# Patient Record
Sex: Male | Born: 2000 | Race: White | Hispanic: No | Marital: Single | Smoking: Current some day smoker
Health system: Southern US, Community
[De-identification: ages and names within clinical notes are randomized; demographics above are authoritative.]

---
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
## 2021-05-20 ENCOUNTER — Other Ambulatory Visit: Payer: Self-pay

## 2021-05-20 ENCOUNTER — Encounter: Payer: Self-pay | Admitting: Emergency Medicine

## 2021-05-20 ENCOUNTER — Ambulatory Visit
Admission: EM | Admit: 2021-05-20 | Discharge: 2021-05-20 | Disposition: A | Discharge status: Home / Self Care | Payer: Medicaid Other | Attending: Urgent Care | Admitting: Urgent Care

## 2021-05-20 DIAGNOSIS — U071 COVID-19: Secondary | ICD-10-CM | POA: Diagnosis not present

## 2021-05-20 DIAGNOSIS — F17200 Nicotine dependence, unspecified, uncomplicated: Secondary | ICD-10-CM

## 2021-05-20 MED ORDER — BENZONATATE 100 MG PO CAPS: 100.0000 mg | ORAL_CAPSULE | Freq: Three times a day (TID) | ORAL | 0 refills | Status: DC | PRN

## 2021-05-20 MED ORDER — PROMETHAZINE-DM 6.25-15 MG/5ML PO SYRP: 5.0000 mL | ORAL_SOLUTION | Freq: Every evening | ORAL | 0 refills | Status: DC | PRN

## 2021-05-20 MED ORDER — CETIRIZINE HCL 10 MG PO TABS: 10.0000 mg | ORAL_TABLET | Freq: Every day | ORAL | 0 refills | Status: DC

## 2021-05-20 MED ORDER — PSEUDOEPHEDRINE HCL 60 MG PO TABS: 60.0000 mg | ORAL_TABLET | Freq: Three times a day (TID) | ORAL | 0 refills | Status: DC | PRN

## 2021-05-20 MED ORDER — PAXLOVID (300/100) 20 X 150 MG & 10 X 100MG PO TBPK: 1.0000 | ORAL_TABLET | Freq: Two times a day (BID) | ORAL | 0 refills | Status: DC

## 2021-05-20 NOTE — ED Triage Notes (Signed)
Pt here with positive at-home covid test with sx since Friday. Interested in anti-virals. Sore throat and bodyaches.

## 2021-05-20 NOTE — ED Provider Notes (Signed)
°  [place]   MRN: [id number] DOB: May 11, 2001  Subjective:   George Jordan is a 21 y.o. male presenting for 4 day history of acute onset throat pain, body aches, coughing.  Patient took a COVID test at home and was positive.  Would like to have COVID antivirals.  Smokes 1 pack per week. Rarely smokes marijuana.  No chest pain, fevers, shortness of breath, wheezing.  Denies taking chronic medications.  Allergies  Allergen Reactions   Augmentin [Amoxicillin-Pot Clavulanate] Rash    History reviewed. No pertinent past medical history.   History reviewed. No pertinent surgical history.  History reviewed. No pertinent family history.  Social History   Tobacco Use   Smoking status: Some Days    Types: Cigarettes  Substance Use Topics   Alcohol use: Yes    Comment: occ   Drug use: Yes    Types: Marijuana    Comment: occ    ROS   Objective:   Vitals: BP 136/89    Pulse 67    Temp 97.6 F (36.4 C)    Resp 20    SpO2 98%   Physical Exam Constitutional:      General: He is not in acute distress.    Appearance: Normal appearance. He is well-developed and normal weight. He is not ill-appearing, toxic-appearing or diaphoretic.  HENT:     Head: Normocephalic and atraumatic.     Right Ear: External ear normal.     Left Ear: External ear normal.     Nose: Nose normal.     Mouth/Throat:     Pharynx: Oropharynx is clear.  Eyes:     General: No scleral icterus.       Right eye: No discharge.        Left eye: No discharge.     Extraocular Movements: Extraocular movements intact.     Pupils: Pupils are equal, round, and reactive to light.  Cardiovascular:     Rate and Rhythm: Normal rate.  Pulmonary:     Effort: Pulmonary effort is normal.  Musculoskeletal:     Cervical back: Normal range of motion.  Neurological:     Mental Status: He is alert and oriented to person, place, and time.  Psychiatric:        Mood and Affect: Mood normal.         Behavior: Behavior normal.        Thought Content: Thought content normal.        Judgment: Judgment normal.    Assessment and Plan :   PDMP not reviewed this encounter.  1. Clinical diagnosis of COVID-19   2. Smoker    Had an extensive discussion with patient about COVID antivirals.  He is a low risk patient but still would like to have Paxlovid.  I will prescribe this to him and advised that since we do not know his kidney function we are taking a risk with this as well.  He verbalized understanding and still wants to proceed.  Use supportive care otherwise.  COVID confirmation test pending. Deferred imaging given clear cardiopulmonary exam, hemodynamically stable vital signs. Counseled patient on potential for adverse effects with medications prescribed/recommended today, ER and return-to-clinic precautions discussed, patient verbalized understanding.    Jaynee Eagles, PA-C 05/20/21 1009

## 2021-05-22 LAB — SARS-COV-2, NAA 2 DAY TAT

## 2021-05-22 LAB — NOVEL CORONAVIRUS, NAA: SARS-CoV-2, NAA: DETECTED — AB

## 2022-05-22 ENCOUNTER — Ambulatory Visit
Admission: RE | Admit: 2022-05-22 | Discharge: 2022-05-22 | Disposition: A | Discharge status: Home / Self Care | Payer: Medicaid Other | Source: Ambulatory Visit | Attending: Family Medicine | Admitting: Family Medicine

## 2022-05-22 VITALS — BP 130/88 | HR 73 | Temp 98.6°F | Resp 18

## 2022-05-22 DIAGNOSIS — J029 Acute pharyngitis, unspecified: Secondary | ICD-10-CM | POA: Diagnosis not present

## 2022-05-22 DIAGNOSIS — R509 Fever, unspecified: Secondary | ICD-10-CM | POA: Diagnosis present

## 2022-05-22 DIAGNOSIS — R059 Cough, unspecified: Secondary | ICD-10-CM | POA: Insufficient documentation

## 2022-05-22 DIAGNOSIS — Z1152 Encounter for screening for COVID-19: Secondary | ICD-10-CM | POA: Diagnosis not present

## 2022-05-22 DIAGNOSIS — J069 Acute upper respiratory infection, unspecified: Secondary | ICD-10-CM | POA: Diagnosis not present

## 2022-05-22 MED ORDER — OSELTAMIVIR PHOSPHATE 75 MG PO CAPS: 75.0000 mg | ORAL_CAPSULE | Freq: Two times a day (BID) | ORAL | 0 refills | Status: DC

## 2022-05-22 NOTE — ED Triage Notes (Signed)
Pt reports a sore throat, fever, body chills, body aches, slight cough, headache x 3 days. Took tylenol which gave slight relief.

## 2022-05-22 NOTE — ED Provider Notes (Signed)
[facility]    CSN: [id number] Arrival date & time: 05/22/22  1510      History   Chief Complaint Chief Complaint  Patient presents with   Fever    Body aches, sore throat - Entered by patient    HPI KOVEN BELINSKY is a 22 y.o. male.   Presenting today with 3-day history of fever, chills, body aches, mild cough, congestion, headache, sore throat.  Denies chest pain, shortness of breath, abdominal pain, nausea vomiting or diarrhea.  Taking Tylenol with mild relief.  Multiple sick contacts recently.  History of seasonal allergies on antihistamines as needed.    History reviewed. No pertinent past medical history.  There are no problems to display for this patient.   History reviewed. No pertinent surgical history.     Home Medications    Prior to Admission medications   Medication Sig Start Date End Date Taking? Authorizing Provider  oseltamivir (TAMIFLU) 75 MG capsule Take 1 capsule (75 mg total) by mouth every 12 (twelve) hours. 05/22/22  Yes Volney American, PA-C  benzonatate (TESSALON) 100 MG capsule Take 1-2 capsules (100-200 mg total) by mouth 3 (three) times daily as needed for cough. 05/20/21   Jaynee Eagles, PA-C  cetirizine (ZYRTEC ALLERGY) 10 MG tablet Take 1 tablet (10 mg total) by mouth daily. 05/20/21   Jaynee Eagles, PA-C  ibuprofen (ADVIL,MOTRIN) 600 MG tablet Take 1 tablet (600 mg total) by mouth every 6 (six) hours as needed for pain. 01/10/13   Kristen Cardinal, NP  nirmatrelvir & ritonavir (PAXLOVID, 300/100,) 20 x 150 MG & 10 x 100MG  TBPK Take 1 tablet by mouth 2 (two) times daily. 05/20/21   Jaynee Eagles, PA-C  promethazine-dextromethorphan (PROMETHAZINE-DM) 6.25-15 MG/5ML syrup Take 5 mLs by mouth at bedtime as needed for cough. 05/20/21   Jaynee Eagles, PA-C  pseudoephedrine (SUDAFED) 60 MG tablet Take 1 tablet (60 mg total) by mouth every 8 (eight) hours as needed for congestion. 05/20/21   Jaynee Eagles, PA-C    Family History History reviewed. No  pertinent family history.  Social History Social History   Tobacco Use   Smoking status: Some Days    Types: Cigarettes  Substance Use Topics   Alcohol use: Yes    Comment: occ   Drug use: Yes    Types: Marijuana    Comment: occ     Allergies   Augmentin [amoxicillin-pot clavulanate]  Review of Systems Review of Systems PER HPI  Physical Exam Triage Vital Signs ED Triage Vitals  Enc Vitals Group     BP 05/22/22 1544 130/88     Pulse Rate 05/22/22 1544 73     Resp 05/22/22 1544 18     Temp 05/22/22 1544 98.6 F (37 C)     Temp Source 05/22/22 1544 Oral     SpO2 05/22/22 1544 98 %     Weight --      Height --      Head Circumference --      Peak Flow --      Pain Score 05/22/22 1545 0     Pain Loc --      Pain Edu? --      Excl. in [place]? --    No data found.  Updated Vital Signs BP 130/88 (BP Location: Right Arm)   Pulse 73   Temp 98.6 F (37 C) (Oral)   Resp 18   SpO2 98%   Visual Acuity Right Eye Distance:   Left  Eye Distance:   Bilateral Distance:    Right Eye Near:   Left Eye Near:    Bilateral Near:     Physical Exam Vitals and nursing note reviewed.  Constitutional:      Appearance: He is well-developed.  HENT:     Head: Atraumatic.     Right Ear: External ear normal.     Left Ear: External ear normal.     Nose: Rhinorrhea present.     Mouth/Throat:     Pharynx: Posterior oropharyngeal erythema present. No oropharyngeal exudate.  Eyes:     Conjunctiva/sclera: Conjunctivae normal.     Pupils: Pupils are equal, round, and reactive to light.  Cardiovascular:     Rate and Rhythm: Normal rate and regular rhythm.  Pulmonary:     Effort: Pulmonary effort is normal. No respiratory distress.     Breath sounds: No wheezing or rales.  Musculoskeletal:        General: Normal range of motion.     Cervical back: Normal range of motion and neck supple.  Lymphadenopathy:     Cervical: No cervical adenopathy.  Skin:    General: Skin is warm and  dry.  Neurological:     Mental Status: He is alert and oriented to person, place, and time.  Psychiatric:        Behavior: Behavior normal.      UC Treatments / Results  Labs (all labs ordered are listed, but only abnormal results are displayed) Labs Reviewed  SARS CORONAVIRUS 2 (TAT 6-24 HRS)    EKG   Radiology No results found.  Procedures Procedures (including critical care time)  Medications Ordered in UC Medications - No data to display  Initial Impression / Assessment and Plan / UC Course  I have reviewed the triage vital signs and the nursing notes.  Pertinent labs & imaging results that were available during my care of the patient were reviewed by me and considered in my medical decision making (see chart for details).     Vitals and exam reassuring today and suspicious for viral respiratory infection.  Suspect COVID versus flu, because we cannot test for flu today due to backorder on testing will treat with Tamiflu while awaiting COVID results.  Return for worsening symptoms.  Supportive over-the-counter medications reviewed.  Final Clinical Impressions(s) / UC Diagnoses   Final diagnoses:  Viral URI with cough     Discharge Instructions      Because we are unable to test you for flu today, we will start the treatment for this while awaiting your COVID test.  If your COVID test is positive you may stop the Tamiflu and someone will call you to discuss next steps.  You may take Tylenol, ibuprofen, DayQuil, NyQuil, Flonase, Mucinex for your symptoms    ED Prescriptions     Medication Sig Dispense Auth. Provider   oseltamivir (TAMIFLU) 75 MG capsule Take 1 capsule (75 mg total) by mouth every 12 (twelve) hours. 10 capsule Volney American, [place]      PDMP not reviewed this encounter.   Volney American, [place] 05/22/22 1627

## 2022-05-22 NOTE — Discharge Instructions (Signed)
Because we are unable to test you for flu today, we will start the treatment for this while awaiting your COVID test.  If your COVID test is positive you may stop the Tamiflu and someone will call you to discuss next steps.  You may take Tylenol, ibuprofen, DayQuil, NyQuil, Flonase, Mucinex for your symptoms

## 2022-05-23 LAB — SARS CORONAVIRUS 2 (TAT 6-24 HRS): SARS Coronavirus 2: NEGATIVE

## 2022-06-09 ENCOUNTER — Ambulatory Visit
Admission: RE | Admit: 2022-06-09 | Discharge: 2022-06-09 | Disposition: A | Discharge status: Home / Self Care | Payer: Medicaid Other | Source: Ambulatory Visit | Attending: Family Medicine | Admitting: Family Medicine

## 2022-06-09 VITALS — BP 147/80 | HR 90 | Temp 98.1°F | Resp 20

## 2022-06-09 DIAGNOSIS — L0501 Pilonidal cyst with abscess: Secondary | ICD-10-CM

## 2022-06-09 MED ORDER — SULFAMETHOXAZOLE-TRIMETHOPRIM 800-160 MG PO TABS: 1.0000 | ORAL_TABLET | Freq: Two times a day (BID) | ORAL | 0 refills | Status: AC

## 2022-06-09 MED ORDER — CHLORHEXIDINE GLUCONATE 4 % EX LIQD: Freq: Every day | CUTANEOUS | 0 refills | Status: DC | PRN

## 2022-06-09 MED ORDER — MUPIROCIN 2 % EX OINT: 1.0000 | TOPICAL_OINTMENT | Freq: Two times a day (BID) | CUTANEOUS | 0 refills | Status: DC

## 2022-06-09 NOTE — ED Triage Notes (Addendum)
Pt reports a boil/ abscess near the intergluteal cleft x 2 weeks ago. It has gotten bigger and more painful. Hurts to sit down and apply pressure.   Area is red and slightly raised on his left buttocks

## 2022-06-09 NOTE — ED Provider Notes (Signed)
[facility]    CSN: [id number] Arrival date & time: 06/09/22  1543      History   Chief Complaint Chief Complaint  Patient presents with   Abscess    Boil - Entered by patient    HPI George Jordan is a 22 y.o. male.   Patient presenting today with 2-week history of progressively worsening swollen and tender area to the gluteal cleft.  States it hurts to sit down or move.  Denies fever, chills, drainage, change to bowel movements, abdominal pain, nausea vomiting.  So far not tried anything over-the-counter for symptoms.  No known injury to the area, no past history of similar issues.    History reviewed. No pertinent past medical history.  There are no problems to display for this patient.   History reviewed. No pertinent surgical history.     Home Medications    Prior to Admission medications   Medication Sig Start Date End Date Taking? Authorizing Provider  chlorhexidine (HIBICLENS) 4 % external liquid Apply topically daily as needed. 06/09/22  Yes Volney American, PA-C  mupirocin ointment (BACTROBAN) 2 % Apply 1 Application topically 2 (two) times daily. 06/09/22  Yes Volney American, PA-C  sulfamethoxazole-trimethoprim (BACTRIM DS) 800-160 MG tablet Take 1 tablet by mouth 2 (two) times daily for 7 days. 06/09/22 06/16/22 Yes Volney American, PA-C  benzonatate (TESSALON) 100 MG capsule Take 1-2 capsules (100-200 mg total) by mouth 3 (three) times daily as needed for cough. 05/20/21   Jaynee Eagles, PA-C  cetirizine (ZYRTEC ALLERGY) 10 MG tablet Take 1 tablet (10 mg total) by mouth daily. 05/20/21   Jaynee Eagles, PA-C  ibuprofen (ADVIL,MOTRIN) 600 MG tablet Take 1 tablet (600 mg total) by mouth every 6 (six) hours as needed for pain. 01/10/13   Kristen Cardinal, NP  nirmatrelvir & ritonavir (PAXLOVID, 300/100,) 20 x 150 MG & 10 x 100MG  TBPK Take 1 tablet by mouth 2 (two) times daily. 05/20/21   Jaynee Eagles, PA-C  oseltamivir (TAMIFLU) 75 MG capsule Take  1 capsule (75 mg total) by mouth every 12 (twelve) hours. 05/22/22   Volney American, PA-C  promethazine-dextromethorphan (PROMETHAZINE-DM) 6.25-15 MG/5ML syrup Take 5 mLs by mouth at bedtime as needed for cough. 05/20/21   Jaynee Eagles, PA-C  pseudoephedrine (SUDAFED) 60 MG tablet Take 1 tablet (60 mg total) by mouth every 8 (eight) hours as needed for congestion. 05/20/21   Jaynee Eagles, PA-C    Family History History reviewed. No pertinent family history.  Social History Social History   Tobacco Use   Smoking status: Some Days    Types: Cigarettes  Substance Use Topics   Alcohol use: Yes    Comment: occ   Drug use: Yes    Types: Marijuana    Comment: occ     Allergies   Augmentin [amoxicillin-pot clavulanate]   Review of Systems Review of Systems Per HPI  Physical Exam Triage Vital Signs ED Triage Vitals  Enc Vitals Group     BP 06/09/22 1603 (!) 147/80     Pulse Rate 06/09/22 1603 90     Resp 06/09/22 1603 20     Temp 06/09/22 1603 98.1 F (36.7 C)     Temp Source 06/09/22 1603 Oral     SpO2 06/09/22 1603 98 %     Weight --      Height --      Head Circumference --      Peak Flow --  Pain Score 06/09/22 1606 10     Pain Loc --      Pain Edu? --      Excl. in [place]? --    No data found.  Updated Vital Signs BP (!) 147/80 (BP Location: Right Arm)   Pulse 90   Temp 98.1 F (36.7 C) (Oral)   Resp 20   SpO2 98%   Visual Acuity Right Eye Distance:   Left Eye Distance:   Bilateral Distance:    Right Eye Near:   Left Eye Near:    Bilateral Near:     Physical Exam Vitals and nursing note reviewed. Exam conducted with a chaperone present.  Constitutional:      Appearance: Normal appearance.  HENT:     Head: Atraumatic.  Eyes:     Extraocular Movements: Extraocular movements intact.     Conjunctiva/sclera: Conjunctivae normal.  Cardiovascular:     Rate and Rhythm: Normal rate and regular rhythm.  Pulmonary:     Effort: Pulmonary effort is  normal.     Breath sounds: Normal breath sounds.  Musculoskeletal:        General: Normal range of motion.     Cervical back: Normal range of motion and neck supple.  Skin:    General: Skin is warm.     Comments: Large fluctuant pilonidal abscess present to gluteal cleft, significantly tender to palpation, no active drainage  Neurological:     General: No focal deficit present.     Mental Status: He is oriented to person, place, and time.  Psychiatric:        Mood and Affect: Mood normal.        Thought Content: Thought content normal.        Judgment: Judgment normal.      UC Treatments / Results  Labs (all labs ordered are listed, but only abnormal results are displayed) Labs Reviewed - No data to display  EKG   Radiology No results found.  Procedures Incision and Drainage  Date/Time: 06/09/2022 6:17 PM  Performed by: Volney American, PA-C Authorized by: Volney American, PA-C   Consent:    Consent obtained:  Verbal   Consent given by:  Patient   Risks, benefits, and alternatives were discussed: yes     Risks discussed:  Bleeding, incomplete drainage and infection   Alternatives discussed:  Alternative treatment Universal protocol:    Procedure explained and questions answered to patient or proxy's satisfaction: yes     Relevant documents present and verified: yes     Patient identity confirmed:  Verbally with patient Location:    Type:  Pilonidal cyst   Size:  4 cm   Location: Gluteal cleft. Pre-procedure details:    Skin preparation:  Chlorhexidine with alcohol Sedation:    Sedation type:  None Anesthesia:    Anesthesia method:  Local infiltration   Local anesthetic:  Lidocaine 2% WITH epi Procedure type:    Complexity:  Simple Procedure details:    Ultrasound guidance: no     Needle aspiration: no     Incision types:  Stab incision   Incision depth:  Dermal   Wound management:  Probed and deloculated   Drainage:  Purulent   Drainage  amount:  Copious   Wound treatment:  Wound left open   Packing materials:  None Post-procedure details:    Procedure completion:  Tolerated well, no immediate complications  (including critical care time)  Medications Ordered in UC Medications - No data  to display  Initial Impression / Assessment and Plan / UC Course  I have reviewed the triage vital signs and the nursing notes.  Pertinent labs & imaging results that were available during my care of the patient were reviewed by me and considered in my medical decision making (see chart for details).     Abscess drained without complication, area cleaned and dressed and will start Bactrim, Hibiclens, Bactroban ointment.  Work note given.  General surgery information given in case returning  Final Clinical Impressions(s) / UC Diagnoses   Final diagnoses:  Pilonidal abscess   Discharge Instructions   None    ED Prescriptions     Medication Sig Dispense Auth. Provider   sulfamethoxazole-trimethoprim (BACTRIM DS) 800-160 MG tablet Take 1 tablet by mouth 2 (two) times daily for 7 days. 14 tablet Particia Nearing, [place]   chlorhexidine (HIBICLENS) 4 % external liquid Apply topically daily as needed. 120 mL Particia Nearing, PA-C   mupirocin ointment (BACTROBAN) 2 % Apply 1 Application topically 2 (two) times daily. 22 g Particia Nearing, [place]      PDMP not reviewed this encounter.   Particia Nearing, [place] 06/09/22 1819

## 2022-07-23 ENCOUNTER — Ambulatory Visit
Admission: RE | Admit: 2022-07-23 | Discharge: 2022-07-23 | Disposition: A | Discharge status: Home / Self Care | Payer: Medicaid Other | Source: Ambulatory Visit | Attending: Nurse Practitioner | Admitting: Nurse Practitioner

## 2022-07-23 VITALS — BP 176/83 | HR 86 | Temp 98.2°F | Resp 16

## 2022-07-23 DIAGNOSIS — R21 Rash and other nonspecific skin eruption: Secondary | ICD-10-CM | POA: Diagnosis not present

## 2022-07-23 MED ORDER — TRIAMCINOLONE ACETONIDE 0.1 % EX CREA: 1.0000 | TOPICAL_CREAM | Freq: Two times a day (BID) | CUTANEOUS | 0 refills | Status: DC

## 2022-07-23 NOTE — ED Triage Notes (Signed)
Pt reports rash in back and left hand x 3-4 days. Pt think can be poison ok/ivy. Benadryl cream relief he itchiness.

## 2022-07-23 NOTE — Discharge Instructions (Signed)
Take medication as prescribed. May also take over-the-counter Zyrtec to help with itching. You may continue Benadryl as directed at bedtime. Avoid hot baths or showers while symptoms persist.  Recommend taking lukewarm baths. May apply cool cloths to the area to help with itching or discomfort. Avoid scratching, rubbing, or manipulating the areas while symptoms persist. Recommend Aveeno Colloidal Oatmeal Bath to use to help with drying and itching. Follow up if symptoms do not improve.

## 2022-07-23 NOTE — ED Provider Notes (Signed)
[facility]    CSN: [id number] Arrival date & time: 07/23/22  1600      History   Chief Complaint Chief Complaint  Patient presents with   Rash    Entered by patient    HPI George Jordan is a 22 y.o. male.   The history is provided by the patient.   The patient presents for complaints of rash to the left lower side of his back and to the left hand.  Patient states rash has been present for the past 3 to 4 days.  He states the rash is itchy.  He states that the area has also increased in size.  Patient denies fever, chills, pain to the rash, fluctuance, oozing, or drainage.  Patient reports he has been taking Benadryl at nighttime to help with itching.  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  History reviewed. No pertinent past medical history.  There are no problems to display for this patient.   History reviewed. No pertinent surgical history.     Home Medications    Prior to Admission medications   Medication Sig Start Date End Date Taking? Authorizing Provider  triamcinolone cream (KENALOG) 0.1 % Apply 1 Application topically 2 (two) times daily. 07/23/22  Yes Khadim Lundberg-Warren, Alda Lea, NP  benzonatate (TESSALON) 100 MG capsule Take 1-2 capsules (100-200 mg total) by mouth 3 (three) times daily as needed for cough. 05/20/21   Jaynee Eagles, PA-C  cetirizine (ZYRTEC ALLERGY) 10 MG tablet Take 1 tablet (10 mg total) by mouth daily. 05/20/21   Jaynee Eagles, PA-C  chlorhexidine (HIBICLENS) 4 % external liquid Apply topically daily as needed. 06/09/22   Volney American, PA-C  ibuprofen (ADVIL,MOTRIN) 600 MG tablet Take 1 tablet (600 mg total) by mouth every 6 (six) hours as needed for pain. 01/10/13   Kristen Cardinal, NP  mupirocin ointment (BACTROBAN) 2 % Apply 1 Application topically 2 (two) times daily. 06/09/22   Volney American, PA-C  nirmatrelvir & ritonavir (PAXLOVID, 300/100,) 20 x 150 MG &  10 x '100MG'$  TBPK Take 1 tablet by mouth 2 (two) times daily. 05/20/21   Jaynee Eagles, PA-C  oseltamivir (TAMIFLU) 75 MG capsule Take 1 capsule (75 mg total) by mouth every 12 (twelve) hours. 05/22/22   Volney American, PA-C  promethazine-dextromethorphan (PROMETHAZINE-DM) 6.25-15 MG/5ML syrup Take 5 mLs by mouth at bedtime as needed for cough. 05/20/21   Jaynee Eagles, PA-C  pseudoephedrine (SUDAFED) 60 MG tablet Take 1 tablet (60 mg total) by mouth every 8 (eight) hours as needed for congestion. 05/20/21   Jaynee Eagles, PA-C    Family History History reviewed. No pertinent family history.  Social History Social History   Tobacco Use   Smoking status: Some Days    Types: Cigarettes  Substance Use Topics   Alcohol use: Yes    Comment: occ   Drug use: Yes    Types: Marijuana    Comment: occ     Allergies   Augmentin [amoxicillin-pot clavulanate]   Review of Systems Review of Systems Per HPI  Physical Exam Triage Vital Signs ED Triage Vitals  Enc Vitals Group     BP 07/23/22 1605 (!) 176/83     Pulse Rate 07/23/22 1605 86     Resp 07/23/22 1605 16     Temp 07/23/22 1605 98.2 F (36.8 C)     Temp Source 07/23/22 1605 Oral  SpO2 07/23/22 1605 96 %     Weight --      Height --      Head Circumference --      Peak Flow --      Pain Score 07/23/22 1608 0     Pain Loc --      Pain Edu? --      Excl. in [place]? --    No data found.  Updated Vital Signs BP (!) 176/83 (BP Location: Right Arm)   Pulse 86   Temp 98.2 F (36.8 C) (Oral)   Resp 16   SpO2 96%   Visual Acuity Right Eye Distance:   Left Eye Distance:   Bilateral Distance:    Right Eye Near:   Left Eye Near:    Bilateral Near:     Physical Exam   UC Treatments / Results  Labs (all labs ordered are listed, but only abnormal results are displayed) Labs Reviewed - No data to display  EKG   Radiology No results found.  Procedures Procedures (including critical care time)  Medications Ordered  in UC Medications - No data to display  Initial Impression / Assessment and Plan / UC Course  I have reviewed the triage vital signs and the nursing notes.  Pertinent labs & imaging results that were available during my care of the patient were reviewed by me and considered in my medical decision making (see chart for details).  The patient is well-appearing, he is in no acute distress, vital signs are stable.  Patient with rash to the left lower back into the left hand.  Differential diagnoses include contact dermatitis, eczema, or shingles.  This likely symptoms appear to be consistent with contact dermatitis.  Will treat patient symptomatically with triamcinolone cream 0.1%.  Supportive care recommendations were provided to the patient, along with indications of when follow-up may be indicated..  Patient is in agreement with this plan of care and verbalizes understanding.  All questions were answered.  Patient stable for discharge.  Final Clinical Impressions(s) / UC Diagnoses   Final diagnoses:  Rash and nonspecific skin eruption     Discharge Instructions      Take medication as prescribed. May also take over-the-counter Zyrtec to help with itching. You may continue Benadryl as directed at bedtime. Avoid hot baths or showers while symptoms persist.  Recommend taking lukewarm baths. May apply cool cloths to the area to help with itching or discomfort. Avoid scratching, rubbing, or manipulating the areas while symptoms persist. Recommend Aveeno Colloidal Oatmeal Bath to use to help with drying and itching. Follow up if symptoms do not improve.      ED Prescriptions     Medication Sig Dispense Auth. Provider   triamcinolone cream (KENALOG) 0.1 % Apply 1 Application topically 2 (two) times daily. 45 g Vasilios Ottaway-Warren, Alda Lea, NP      PDMP not reviewed this encounter.   Tish Men, NP 07/23/22 1623

## 2022-09-17 ENCOUNTER — Ambulatory Visit: Payer: Self-pay

## 2022-09-17 ENCOUNTER — Ambulatory Visit
Admission: EM | Admit: 2022-09-17 | Discharge: 2022-09-17 | Disposition: A | Discharge status: Home / Self Care | Payer: Medicaid Other | Attending: Nurse Practitioner | Admitting: Nurse Practitioner

## 2022-09-17 DIAGNOSIS — J020 Streptococcal pharyngitis: Secondary | ICD-10-CM | POA: Diagnosis not present

## 2022-09-17 LAB — POCT RAPID STREP A (OFFICE): Rapid Strep A Screen: POSITIVE — AB

## 2022-09-17 MED ORDER — LIDOCAINE VISCOUS HCL 2 % MT SOLN: 5.0000 mL | Freq: Four times a day (QID) | OROMUCOSAL | 0 refills | Status: DC | PRN

## 2022-09-17 MED ORDER — AZITHROMYCIN 500 MG PO TABS: 500.0000 mg | ORAL_TABLET | Freq: Every day | ORAL | 0 refills | Status: AC

## 2022-09-17 NOTE — ED Provider Notes (Signed)
[facility]    CSN: [id number] Arrival date & time: 09/17/22  1018      History   Chief Complaint No chief complaint on file.   HPI George Jordan is a 22 y.o. male.   The history is provided by the patient.   The patient presents for complaints of sore throat, and chills, fever.  Patient states symptoms started over the last several days.  He states this morning when he woke up around 7 AM, he had a fever of 101.  Patient states that he has pain with swallowing, and talking.  Patient states that he has also had a mild cough and intermittent abdominal pain.  He states that is also difficult for him to eat due to his throat pain and swelling.  Patient denies ear pain, ear drainage, headache, wheezing, shortness of breath, difficulty breathing, nausea, vomiting, or diarrhea.  Patient denies any obvious known sick contacts.  Reports he has been taking ibuprofen for his symptoms.  History reviewed. No pertinent past medical history.  There are no problems to display for this patient.   History reviewed. No pertinent surgical history.     Home Medications    Prior to Admission medications   Medication Sig Start Date End Date Taking? Authorizing Provider  azithromycin (ZITHROMAX) 500 MG tablet Take 1 tablet (500 mg total) by mouth daily for 5 days. 09/17/22 09/22/22 Yes Camyah Pultz-Warren, Sadie Haber, NP  lidocaine (XYLOCAINE) 2 % solution Use as directed 5 mLs in the mouth or throat every 6 (six) hours as needed for mouth pain. Gargle and spit 5 mL every 6 hours as needed for throat pain or discomfort. 09/17/22  Yes Terrica Duecker-Warren, Sadie Haber, NP  benzonatate (TESSALON) 100 MG capsule Take 1-2 capsules (100-200 mg total) by mouth 3 (three) times daily as needed for cough. 05/20/21   Wallis Bamberg, PA-C  cetirizine (ZYRTEC ALLERGY) 10 MG tablet Take 1 tablet (10 mg total) by mouth daily. 05/20/21   Wallis Bamberg, PA-C  chlorhexidine (HIBICLENS) 4 % external liquid Apply topically daily as  needed. 06/09/22   Particia Nearing, PA-C  ibuprofen (ADVIL,MOTRIN) 600 MG tablet Take 1 tablet (600 mg total) by mouth every 6 (six) hours as needed for pain. 01/10/13   Lowanda Foster, NP  mupirocin ointment (BACTROBAN) 2 % Apply 1 Application topically 2 (two) times daily. 06/09/22   Particia Nearing, PA-C  nirmatrelvir & ritonavir (PAXLOVID, 300/100,) 20 x 150 MG & 10 x 100MG  TBPK Take 1 tablet by mouth 2 (two) times daily. 05/20/21   Wallis Bamberg, PA-C  oseltamivir (TAMIFLU) 75 MG capsule Take 1 capsule (75 mg total) by mouth every 12 (twelve) hours. 05/22/22   Particia Nearing, PA-C  promethazine-dextromethorphan (PROMETHAZINE-DM) 6.25-15 MG/5ML syrup Take 5 mLs by mouth at bedtime as needed for cough. 05/20/21   Wallis Bamberg, PA-C  pseudoephedrine (SUDAFED) 60 MG tablet Take 1 tablet (60 mg total) by mouth every 8 (eight) hours as needed for congestion. 05/20/21   Wallis Bamberg, PA-C  triamcinolone cream (KENALOG) 0.1 % Apply 1 Application topically 2 (two) times daily. 07/23/22   Cecile Gillispie-Warren, Sadie Haber, NP    Family History History reviewed. No pertinent family history.  Social History Social History   Tobacco Use   Smoking status: Some Days    Types: Cigarettes  Substance Use Topics   Alcohol use: Yes    Comment: occ   Drug use: Yes    Types: Marijuana    Comment: occ  Allergies   Augmentin [amoxicillin-pot clavulanate]   Review of Systems Review of Systems Per HPI  Physical Exam Triage Vital Signs ED Triage Vitals  Enc Vitals Group     BP 09/17/22 1040 136/83     Pulse Rate 09/17/22 1040 81     Resp 09/17/22 1040 18     Temp 09/17/22 1040 98.2 F (36.8 C)     Temp Source 09/17/22 1040 Oral     SpO2 09/17/22 1040 98 %     Weight --      Height --      Head Circumference --      Peak Flow --      Pain Score 09/17/22 1042 7     Pain Loc --      Pain Edu? --      Excl. in [place]? --    No data found.  Updated Vital Signs BP 136/83 (BP Location: Right  Arm)   Pulse 81   Temp 98.2 F (36.8 C) (Oral)   Resp 18   SpO2 98%   Visual Acuity Right Eye Distance:   Left Eye Distance:   Bilateral Distance:    Right Eye Near:   Left Eye Near:    Bilateral Near:     Physical Exam Vitals and nursing note reviewed.  Constitutional:      General: He is not in acute distress.    Appearance: Normal appearance.  HENT:     Head: Normocephalic.     Right Ear: Tympanic membrane, ear canal and external ear normal.     Left Ear: Tympanic membrane, ear canal and external ear normal.     Nose: Nose normal.     Mouth/Throat:     Lips: Pink.     Mouth: Mucous membranes are moist.     Pharynx: Pharyngeal swelling, oropharyngeal exudate, posterior oropharyngeal erythema and uvula swelling present.     Tonsils: Tonsillar exudate present. 2+ on the right. 2+ on the left.  Eyes:     Extraocular Movements: Extraocular movements intact.     Conjunctiva/sclera: Conjunctivae normal.     Pupils: Pupils are equal, round, and reactive to light.  Cardiovascular:     Rate and Rhythm: Normal rate and regular rhythm.     Pulses: Normal pulses.     Heart sounds: Normal heart sounds.  Pulmonary:     Effort: Pulmonary effort is normal. No respiratory distress.     Breath sounds: Normal breath sounds. No stridor. No wheezing, rhonchi or rales.  Abdominal:     General: Bowel sounds are normal.     Palpations: Abdomen is soft.     Tenderness: There is no abdominal tenderness.  Musculoskeletal:     Cervical back: Normal range of motion.  Lymphadenopathy:     Cervical: Cervical adenopathy present.  Skin:    General: Skin is warm and dry.  Neurological:     General: No focal deficit present.     Mental Status: He is alert and oriented to person, place, and time.  Psychiatric:        Mood and Affect: Mood normal.        Behavior: Behavior normal.      UC Treatments / Results  Labs (all labs ordered are listed, but only abnormal results are  displayed) Labs Reviewed  POCT RAPID STREP A (OFFICE) - Abnormal; Notable for the following components:      Result Value   Rapid Strep A Screen Positive (*)  All other components within normal limits    EKG   Radiology No results found.  Procedures Procedures (including critical care time)  Medications Ordered in UC Medications - No data to display  Initial Impression / Assessment and Plan / UC Course  I have reviewed the triage vital signs and the nursing notes.  Pertinent labs & imaging results that were available during my care of the patient were reviewed by me and considered in my medical decision making (see chart for details).  The patient is well-appearing, he is in no acute distress, vital signs are stable.  Rapid strep test is positive.  Will treat for strep throat with azithromycin 500 mg daily for the next 5 days given the patient's history of allergies to Augmentin.  Patient was also prescribed viscous lidocaine 2% to gargle and spit as needed for throat pain or discomfort.  Supportive care recommendations were provided and discussed with the patient to include continuing over-the-counter analgesics for pain or discomfort, recommending a soft diet, and warm salt water gargles.  Patient was advised that if symptoms do not improve, recommend that he follow-up in this clinic or with his PCP for further evaluation.  Patient is in agreement with this plan of care and verbalizes understanding.  All questions were answered.  Patient stable for discharge.   Final Clinical Impressions(s) / UC Diagnoses   Final diagnoses:  Streptococcal sore throat     Discharge Instructions      Take medication as prescribed. Increase fluids and allow for plenty of rest. Recommend over-the-counter Tylenol or ibuprofen as needed for pain, fever, or general discomfort. Warm salt water gargles 3-4 times daily to help with throat pain or discomfort. Recommend a diet with soft foods to  include soups, broths, puddings, yogurt, Jell-O's, soft vegetables, fruit, or popsicles until symptoms improve. Change toothbrush after 3 days. If symptoms do not improve after completing this antibiotic, please follow-up in this clinic or with your primary care physician for further evaluation. Follow-up as needed.     ED Prescriptions     Medication Sig Dispense Auth. Provider   azithromycin (ZITHROMAX) 500 MG tablet Take 1 tablet (500 mg total) by mouth daily for 5 days. 5 tablet Sedonia Kitner-Warren, Sadie Haber, NP   lidocaine (XYLOCAINE) 2 % solution Use as directed 5 mLs in the mouth or throat every 6 (six) hours as needed for mouth pain. Gargle and spit 5 mL every 6 hours as needed for throat pain or discomfort. 100 mL Shervin Cypert-Warren, Sadie Haber, NP      PDMP not reviewed this encounter.   Abran Cantor, NP 09/17/22 1109

## 2022-09-17 NOTE — Discharge Instructions (Addendum)
Take medication as prescribed. Increase fluids and allow for plenty of rest. Recommend over-the-counter Tylenol or ibuprofen as needed for pain, fever, or general discomfort. Warm salt water gargles 3-4 times daily to help with throat pain or discomfort. Recommend a diet with soft foods to include soups, broths, puddings, yogurt, Jell-O's, soft vegetables, fruit, or popsicles until symptoms improve. Change toothbrush after 3 days. If symptoms do not improve after completing this antibiotic, please follow-up in this clinic or with your primary care physician for further evaluation. Follow-up as needed.

## 2022-09-17 NOTE — ED Triage Notes (Signed)
Pt c/o sore throat and fever 101. At 7:00 am, sore throat x 3 days

## 2023-03-09 ENCOUNTER — Ambulatory Visit
Admission: RE | Admit: 2023-03-09 | Discharge: 2023-03-09 | Disposition: A | Discharge status: Home / Self Care | Payer: Medicaid Other | Source: Ambulatory Visit | Attending: Nurse Practitioner | Admitting: Nurse Practitioner

## 2023-03-09 VITALS — BP 149/82 | HR 66 | Temp 98.2°F | Resp 17

## 2023-03-09 DIAGNOSIS — R21 Rash and other nonspecific skin eruption: Secondary | ICD-10-CM

## 2023-03-09 MED ORDER — DEXAMETHASONE SODIUM PHOSPHATE 10 MG/ML IJ SOLN
10.0000 mg | Freq: Once | INTRAMUSCULAR | Status: AC
  Administered 2023-03-09: 10 mg via INTRAMUSCULAR

## 2023-03-09 MED ORDER — TRIAMCINOLONE ACETONIDE 0.1 % EX OINT: 1.0000 | TOPICAL_OINTMENT | Freq: Two times a day (BID) | CUTANEOUS | 0 refills | Status: DC

## 2023-03-09 NOTE — ED Provider Notes (Signed)
[facility]    CSN: [id number] Arrival date & time: 03/09/23  1048      History   Chief Complaint Chief Complaint  Patient presents with   Poison Ivy    Rash on thigh, hip & arm - Entered by patient    HPI George Jordan is a 22 y.o. male.   Patient presents today for itchy rash to right arm and right thigh, left hip that has been ongoing for the past 5 days.  Reports prior to symptoms starting, he was working outside and noticed he was working in weeds.  Reports the areas are raised, red, and itchy.  No fever or nausea/vomiting.  No active drainage from the areas.  Patient denies recent change in any detergents, soaps, personal care products.  No recent change in medications or supplements.  Has been using over-the-counter calamine spray without relief.     History reviewed. No pertinent past medical history.  There are no problems to display for this patient.   History reviewed. No pertinent surgical history.     Home Medications    Prior to Admission medications   Medication Sig Start Date End Date Taking? Authorizing Provider  triamcinolone ointment (KENALOG) 0.1 % Apply 1 Application topically 2 (two) times daily. Apply sparingly to affected areas twice daily.  Do not use for more than 14 days in a row to prevent permanent skin thinning. 03/09/23  Yes Valentino Nose, NP    Family History History reviewed. No pertinent family history.  Social History Social History   Tobacco Use   Smoking status: Some Days    Types: Cigarettes  Substance Use Topics   Alcohol use: Yes    Comment: occ   Drug use: Yes    Types: Marijuana    Comment: occ     Allergies   Augmentin [amoxicillin-pot clavulanate]   Review of Systems Review of Systems Per HPI  Physical Exam Triage Vital Signs ED Triage Vitals  Encounter Vitals Group     BP 03/09/23 1128 (!) 149/82     Systolic BP Percentile --      Diastolic BP Percentile --      Pulse Rate  03/09/23 1128 66     Resp 03/09/23 1128 17     Temp 03/09/23 1128 98.2 F (36.8 C)     Temp Source 03/09/23 1128 Oral     SpO2 03/09/23 1128 97 %     Weight --      Height --      Head Circumference --      Peak Flow --      Pain Score 03/09/23 1130 0     Pain Loc --      Pain Education --      Exclude from Growth Chart --    No data found.  Updated Vital Signs BP (!) 149/82 (BP Location: Right Arm)   Pulse 66   Temp 98.2 F (36.8 C) (Oral)   Resp 17   SpO2 97%   Visual Acuity Right Eye Distance:   Left Eye Distance:   Bilateral Distance:    Right Eye Near:   Left Eye Near:    Bilateral Near:     Physical Exam Vitals and nursing note reviewed.  Constitutional:      General: He is not in acute distress.    Appearance: Normal appearance. He is not toxic-appearing.  HENT:     Head: Normocephalic and atraumatic.     Mouth/Throat:  Mouth: Mucous membranes are moist.     Pharynx: Oropharynx is clear.  Pulmonary:     Effort: Pulmonary effort is normal. No respiratory distress.     Breath sounds: No wheezing, rhonchi or rales.  Skin:    General: Skin is warm and dry.     Capillary Refill: Capillary refill takes less than 2 seconds.     Coloration: Skin is not jaundiced or pale.     Findings: Erythema and rash present.     Comments: Erythematous, raised rash noted to right forearm and right inner thigh.  No fluctuance, warmth, active drainage.  Neurological:     Mental Status: He is alert and oriented to person, place, and time.  Psychiatric:        Behavior: Behavior is cooperative.      UC Treatments / Results  Labs (all labs ordered are listed, but only abnormal results are displayed) Labs Reviewed - No data to display  EKG   Radiology No results found.  Procedures Procedures (including critical care time)  Medications Ordered in UC Medications  dexamethasone (DECADRON) injection 10 mg (10 mg Intramuscular Given 03/09/23 1156)    Initial  Impression / Assessment and Plan / UC Course  I have reviewed the triage vital signs and the nursing notes.  Pertinent labs & imaging results that were available during my care of the patient were reviewed by me and considered in my medical decision making (see chart for details).   Patient is well-appearing, normotensive, afebrile, not tachycardic, not tachypneic, oxygenating well on room air.    1. Rash and nonspecific skin eruption Noted flags; vitals and exam are reassuring Treat with Decadron 10 mg IM in urgent care today, start topical corticosteroid ointment-sent to pharmacy Also recommended oral antihistamine regimen Strict ER and return precautions discussed with patient  The patient was given the opportunity to ask questions.  All questions answered to their satisfaction.  The patient is in agreement to this plan.    Final Clinical Impressions(s) / UC Diagnoses   Final diagnoses:  Rash and nonspecific skin eruption     Discharge Instructions      We gave you an injection of steroid medication today to help with inflammation from the rash.  Start using the topical ointment I sent to the pharmacy to help dry the rash up.  You can also take Zyrtec, Claritin, or Allegra adult dose twice daily to help with itching.  Seek care if symptoms do not improve with this treatment.    ED Prescriptions     Medication Sig Dispense Auth. Provider   triamcinolone ointment (KENALOG) 0.1 % Apply 1 Application topically 2 (two) times daily. Apply sparingly to affected areas twice daily.  Do not use for more than 14 days in a row to prevent permanent skin thinning. 15 g Valentino Nose, NP      PDMP not reviewed this encounter.   Valentino Nose, NP 03/09/23 [phone]

## 2023-03-09 NOTE — ED Triage Notes (Addendum)
Pt c/o rash on left hip right thigh and right  arm x 5 days constant itching

## 2023-03-09 NOTE — Discharge Instructions (Signed)
We gave you an injection of steroid medication today to help with inflammation from the rash.  Start using the topical ointment I sent to the pharmacy to help dry the rash up.  You can also take Zyrtec, Claritin, or Allegra adult dose twice daily to help with itching.  Seek care if symptoms do not improve with this treatment.

## 2023-06-21 ENCOUNTER — Ambulatory Visit
Admission: RE | Admit: 2023-06-21 | Discharge: 2023-06-21 | Disposition: A | Discharge status: Home / Self Care | Payer: 59 | Source: Ambulatory Visit | Attending: Nurse Practitioner | Admitting: Nurse Practitioner

## 2023-06-21 VITALS — BP 149/96 | HR 58 | Temp 98.4°F | Resp 18

## 2023-06-21 DIAGNOSIS — L0591 Pilonidal cyst without abscess: Secondary | ICD-10-CM

## 2023-06-21 MED ORDER — SULFAMETHOXAZOLE-TRIMETHOPRIM 800-160 MG PO TABS: 1.0000 | ORAL_TABLET | Freq: Two times a day (BID) | ORAL | 0 refills | Status: AC

## 2023-06-21 MED ORDER — CHLORHEXIDINE GLUCONATE 4 % EX SOLN: Freq: Two times a day (BID) | CUTANEOUS | 0 refills | Status: DC

## 2023-06-21 MED ORDER — MUPIROCIN 2 % EX OINT: 1.0000 | TOPICAL_OINTMENT | Freq: Two times a day (BID) | CUTANEOUS | 0 refills | Status: DC

## 2023-06-21 NOTE — Discharge Instructions (Addendum)
Take medication as prescribed. Warm compresses to the affected area 3-4 times daily. Keep the area covered while it is draining. Go to the emergency department if you develop fever, chills, generalized fatigue, nausea, vomiting, or if the area of redness spreads into the genital region, or if you have foul-smelling drainage.  Please contact the general surgeons office tomorrow to schedule an appointment for further evaluation. Follow-up as needed.

## 2023-06-21 NOTE — ED Triage Notes (Signed)
Pt reports he has an abscess on his a buttocks x 1 week.

## 2023-06-21 NOTE — ED Provider Notes (Signed)
[facility]    CSN: [id number] Arrival date & time: 06/21/23  1330      History   Chief Complaint Chief Complaint  Patient presents with   Abscess    Boil on behind - Entered by patient    HPI George CONRY is a 23 y.o. male.   The history is provided by the patient.   Patient presenting today with 1-week history of progressively worsening swollen and tender area to the gluteal cleft.  States it hurts to sit down or move.  Denies fever, chills, drainage, change to bowel movements, abdominal pain, nausea vomiting.  So far not tried anything over-the-counter for symptoms.  No known injury to the area, reports prior history of same.   History reviewed. No pertinent past medical history.  There are no active problems to display for this patient.   History reviewed. No pertinent surgical history.     Home Medications    Prior to Admission medications   Medication Sig Start Date End Date Taking? Authorizing Provider  triamcinolone ointment (KENALOG) 0.1 % Apply 1 Application topically 2 (two) times daily. Apply sparingly to affected areas twice daily.  Do not use for more than 14 days in a row to prevent permanent skin thinning. 03/09/23   Valentino Nose, NP    Family History History reviewed. No pertinent family history.  Social History Social History   Tobacco Use   Smoking status: Some Days    Types: Cigarettes  Substance Use Topics   Alcohol use: Yes    Comment: occ   Drug use: Yes    Types: Marijuana    Comment: occ     Allergies   Augmentin [amoxicillin-pot clavulanate]   Review of Systems Review of Systems   Physical Exam Triage Vital Signs ED Triage Vitals  Encounter Vitals Group     BP 06/21/23 1453 (!) 149/96     Systolic BP Percentile --      Diastolic BP Percentile --      Pulse Rate 06/21/23 1453 (!) 58     Resp 06/21/23 1453 18     Temp 06/21/23 1453 98.4 F (36.9 C)     Temp src --      SpO2 06/21/23 1453 98 %      Weight --      Height --      Head Circumference --      Peak Flow --      Pain Score 06/21/23 1414 5     Pain Loc --      Pain Education --      Exclude from Growth Chart --    No data found.  Updated Vital Signs BP (!) 149/96   Pulse (!) 58   Temp 98.4 F (36.9 C)   Resp 18   SpO2 98%   Visual Acuity Right Eye Distance:   Left Eye Distance:   Bilateral Distance:    Right Eye Near:   Left Eye Near:    Bilateral Near:     Physical Exam Vitals and nursing note reviewed.  Constitutional:      General: He is not in acute distress.    Appearance: Normal appearance.  HENT:     Head: Normocephalic.  Eyes:     Extraocular Movements: Extraocular movements intact.     Pupils: Pupils are equal, round, and reactive to light.  Abdominal:     General: Bowel sounds are normal.     Palpations: Abdomen is  soft.  Skin:    Comments: Fluctuant pilonidal abscess present to gluteal cleft, significantly tender to palpation, no active drainage.   Neurological:     Mental Status: He is alert.  Psychiatric:        Mood and Affect: Mood normal.        Behavior: Behavior normal.      UC Treatments / Results  Labs (all labs ordered are listed, but only abnormal results are displayed) Labs Reviewed - No data to display  EKG   Radiology No results found.  Procedures Incision and Drainage  Date/Time: 06/21/2023 3:40 PM  Performed by: Abran Cantor, NP Authorized by: Abran Cantor, NP   Consent:    Consent obtained:  Verbal   Consent given by:  Patient   Risks discussed:  Bleeding, incomplete drainage and infection   Alternatives discussed:  No treatment Universal protocol:    Procedure explained and questions answered to patient or proxy's satisfaction: yes     Patient identity confirmed:  Verbally with patient Location:    Type:  Pilonidal cyst   Size:  2.5cm   Location: natal cleft. Pre-procedure details:    Procedure prep: Sterile water and  Hibiclens soap. Anesthesia:    Anesthesia method:  Local infiltration   Local anesthetic:  Lidocaine 2% w/o epi Procedure type:    Complexity:  Simple Procedure details:    Ultrasound guidance: no     Needle aspiration: no     Incision types:  Single straight   Drainage:  Bloody   Drainage amount:  Moderate   Wound treatment:  Wound left open   Packing materials:  None Post-procedure details:    Procedure completion:  Tolerated Comments:     Pilonidal cyst noted to the natal cleft.  Local anesthesia achieved with lidocaine 2% without epi, 3 mL.  #11 blade used to perform single straight incision.  Moderate bloody drainage.  Patient tolerated the procedure well.  Antibiotic ointment and dressing applied to the site.  (including critical care time)  Medications Ordered in UC Medications - No data to display  Initial Impression / Assessment and Plan / UC Course  I have reviewed the triage vital signs and the nursing notes.  Pertinent labs & imaging results that were available during my care of the patient were reviewed by me and considered in my medical decision making (see chart for details).  Pilonidal cyst present to the natal cleft.  Moderate bloody drainage present.  Will start patient on Bactrim DS to cover for bacterial etiology.  Hibiclens soap was also provided for patient to cleanse the area along with mupirocin 2% ointment.  Supportive care recommendations were provided and discussed with the patient to include fluids, continuing over-the-counter analgesics, and warm compresses.  Patient was provided a referral to general surgery for further evaluation.  Patient was in agreement with this plan of care and verbalizes understanding.  All questions were answered.  Patient stable for discharge.  Final Clinical Impressions(s) / UC Diagnoses   Final diagnoses:  None   Discharge Instructions   None    ED Prescriptions   None    PDMP not reviewed this encounter.    Abran Cantor, NP 06/21/23 [phone]

## 2023-07-09 ENCOUNTER — Ambulatory Visit: Payer: 59 | Admitting: General Surgery

## 2023-07-21 ENCOUNTER — Ambulatory Visit: Payer: 59 | Admitting: Surgery

## 2023-07-21 ENCOUNTER — Encounter: Payer: Self-pay | Admitting: Surgery

## 2023-07-21 VITALS — BP 146/87 | HR 94 | Temp 98.0°F | Resp 14 | Ht 68.5 in | Wt 259.0 lb

## 2023-07-21 DIAGNOSIS — L0591 Pilonidal cyst without abscess: Secondary | ICD-10-CM

## 2023-07-21 NOTE — Progress Notes (Unsigned)
 [facility] History and Physical  Reason for Referral: Pilonidal cyst Referring Physician: Urgent Care  Chief Complaint   New Patient (Initial Visit)     George Jordan is a 23 y.o. male.  HPI: Patient presents for evaluation of a pilonidal cyst.  It has been present for at least 1 to 2 years.  It initially came up and he was able to pop it.  Since that time, it will continue to return and cause him pain, especially when sitting.  He will always try to pop the area, but recently he has had more difficulty with popping it.  He has had 2-3 incision and drainages performed at urgent care.  His most recent incision and drainage was early February.  He was also put on antibiotics at that time.  He has since finished the antibiotics.  He denies any other lumps or bumps anywhere on his body.  He denies any significant past medical history.  He denies ever having any surgeries.  He consumes a vape and goes through a cartridge about every 2 weeks.  He will rarely consume alcohol.  He denies use of illicit drugs.  No past medical history on file.  No past surgical history on file.  No family history on file.  Social History   Tobacco Use   Smoking status: Some Days    Types: Cigarettes  Substance Use Topics   Alcohol use: Yes    Comment: occ   Drug use: Yes    Types: Marijuana    Comment: occ    Medications: I have reviewed the patient's current medications. Allergies as of 07/21/2023       Reactions   Augmentin [amoxicillin-pot Clavulanate] Rash        Medication List        Accurate as of July 21, 2023  9:58 AM. If you have any questions, ask your nurse or doctor.          STOP taking these medications    chlorhexidine 4 % external liquid Commonly known as: Hibiclens Stopped by: George Jordan   mupirocin ointment 2 % Commonly known as: BACTROBAN Stopped by: George Jordan   triamcinolone ointment 0.1 % Commonly known as:  KENALOG Stopped by: George Jordan         ROS:  Constitutional: negative for chills, fatigue, and fevers Eyes: negative for visual disturbance and pain Ears, nose, mouth, throat, and face: negative for ear drainage, sore throat, and sinus problems Respiratory: negative for cough, wheezing, and shortness of breath Cardiovascular: negative for chest pain and palpitations Gastrointestinal: negative for abdominal pain, nausea, reflux symptoms, and vomiting Genitourinary:negative for dysuria and frequency Integument/breast: negative for dryness and rash Hematologic/lymphatic: negative for bleeding and lymphadenopathy Musculoskeletal:negative for back pain and neck pain Neurological: negative for dizziness and tremors Endocrine: negative for temperature intolerance  Blood pressure (!) 146/87, pulse 94, temperature 98 F (36.7 C), temperature source Oral, resp. rate 14, height 5' 8.5" (1.74 m), weight 259 lb (117.5 kg), SpO2 94%. Physical Exam Vitals reviewed.  Constitutional:      Appearance: Normal appearance.  HENT:     Head: Normocephalic and atraumatic.     Mouth/Throat:     Mouth: Mucous membranes are moist.  Cardiovascular:     Rate and Rhythm: Normal rate and regular rhythm.  Pulmonary:     Effort: Pulmonary effort is normal.     Breath sounds: Normal breath sounds.  Abdominal:     General: There  is no distension.     Palpations: Abdomen is soft.     Tenderness: There is no abdominal tenderness.  Musculoskeletal:        General: Normal range of motion.     Cervical back: Normal range of motion.  Skin:    General: Skin is warm and dry.     Comments: Small cystic area measuring 1 to 2 cm in size on the upper left gluteal cheek, no surrounding erythema or induration, nontender to palpation  Neurological:     General: No focal deficit present.     Mental Status: He is alert and oriented to person, place, and time.  Psychiatric:        Mood and Affect: Mood  normal.        Behavior: Behavior normal.    Results: No results found for this or any previous visit (from the past 48 hours).  No results found.   Assessment & Plan:  George Jordan is a 23 y.o. male who presents for evaluation of a pilonidal cyst.  -We discussed that pilonidal cysts/ sinuses are abnormal tissue under the skin that is prone to infections. It is more common in patients that are overweight, family history of a pilonidal cyst, deep gluteal cleft and thicker body hair, especially in the region of the gluteal cleft. We discussed that the exact cause of the disease is unknown but could be related to ingrown hairs/ and inflammation in the area coupled with mechanical / shear forces. We discussed that these cysts and sinuses can be surgically removed but that they often recur.  Prior to surgery, options for management include keeping the area clean by removal of hair from the area to decrease bacterial load and prevent trapping of feces in the area, cleansing the area after bowel movements, and daily to twice daily showering as well as keeping the area dry and wicking away moisture from the area if needed with dry gauze replaced often.  Also discussed pulling hair from the pits as this is one cause of the sinus getting clogged and infected.   -The risk and benefits of pilonidal cyst excision were discussed including but not limited to bleeding, infection, injury to surrounding structures, need for additional procedures, and pilonidal cyst recurrence.  After careful consideration, George Jordan has decided to proceed with surgery.  -Patient tentatively scheduled for surgery on 3/20 -Information provided to the patient regarding pilonidal cyst and pilonidal cyst removal -Advised that he needs to call the office or present to an urgent care if he begins to have worsening pain, fever, chills, or purulent drainage from the area  All questions were answered to the satisfaction of the patient  and family.  Note: Portions of this report may have been transcribed using voice recognition software. Every effort has been made to ensure accuracy; however, inadvertent computerized transcription errors may still be present.   Theophilus Kinds, DO [facility] [address] [place], [place] [address] [phone] (office)

## 2023-07-22 NOTE — H&P (Signed)
 [facility] History and Physical  Reason for Referral: Pilonidal cyst Referring Physician: Urgent Care  Chief Complaint   New Patient (Initial Visit)     George Jordan is a 23 y.o. male.  HPI: Patient presents for evaluation of a pilonidal cyst.  It has been present for at least 1 to 2 years.  It initially came up and he was able to pop it.  Since that time, it will continue to return and cause him pain, especially when sitting.  He will always try to pop the area, but recently he has had more difficulty with popping it.  He has had 2-3 incision and drainages performed at urgent care.  His most recent incision and drainage was early February.  He was also put on antibiotics at that time.  He has since finished the antibiotics.  He denies any other lumps or bumps anywhere on his body.  He denies any significant past medical history.  He denies ever having any surgeries.  He consumes a vape and goes through a cartridge about every 2 weeks.  He will rarely consume alcohol.  He denies use of illicit drugs.  No past medical history on file.  No past surgical history on file.  No family history on file.  Social History   Tobacco Use   Smoking status: Some Days    Types: Cigarettes  Substance Use Topics   Alcohol use: Yes    Comment: occ   Drug use: Yes    Types: Marijuana    Comment: occ    Medications: I have reviewed the patient's current medications. Allergies as of 07/21/2023       Reactions   Augmentin [amoxicillin-pot Clavulanate] Rash        Medication List        Accurate as of July 21, 2023  9:58 AM. If you have any questions, ask your nurse or doctor.          STOP taking these medications    chlorhexidine 4 % external liquid Commonly known as: Hibiclens Stopped by: Ignatius Kloos A Jireh Elmore   mupirocin ointment 2 % Commonly known as: BACTROBAN Stopped by: Laurabelle Gorczyca A Brenleigh Collet   triamcinolone ointment 0.1 % Commonly known as:  KENALOG Stopped by: Azul Coffie A Rio Kidane         ROS:  Constitutional: negative for chills, fatigue, and fevers Eyes: negative for visual disturbance and pain Ears, nose, mouth, throat, and face: negative for ear drainage, sore throat, and sinus problems Respiratory: negative for cough, wheezing, and shortness of breath Cardiovascular: negative for chest pain and palpitations Gastrointestinal: negative for abdominal pain, nausea, reflux symptoms, and vomiting Genitourinary:negative for dysuria and frequency Integument/breast: negative for dryness and rash Hematologic/lymphatic: negative for bleeding and lymphadenopathy Musculoskeletal:negative for back pain and neck pain Neurological: negative for dizziness and tremors Endocrine: negative for temperature intolerance  Blood pressure (!) 146/87, pulse 94, temperature 98 F (36.7 C), temperature source Oral, resp. rate 14, height 5' 8.5" (1.74 m), weight 259 lb (117.5 kg), SpO2 94%. Physical Exam Vitals reviewed.  Constitutional:      Appearance: Normal appearance.  HENT:     Head: Normocephalic and atraumatic.     Mouth/Throat:     Mouth: Mucous membranes are moist.  Cardiovascular:     Rate and Rhythm: Normal rate and regular rhythm.  Pulmonary:     Effort: Pulmonary effort is normal.     Breath sounds: Normal breath sounds.  Abdominal:     General: There  is no distension.     Palpations: Abdomen is soft.     Tenderness: There is no abdominal tenderness.  Musculoskeletal:        General: Normal range of motion.     Cervical back: Normal range of motion.  Skin:    General: Skin is warm and dry.     Comments: Small cystic area measuring 1 to 2 cm in size on the upper left gluteal cheek, no surrounding erythema or induration, nontender to palpation  Neurological:     General: No focal deficit present.     Mental Status: He is alert and oriented to person, place, and time.  Psychiatric:        Mood and Affect: Mood  normal.        Behavior: Behavior normal.    Results: No results found for this or any previous visit (from the past 48 hours).  No results found.   Assessment & Plan:  George Jordan is a 23 y.o. male who presents for evaluation of a pilonidal cyst.  -We discussed that pilonidal cysts/ sinuses are abnormal tissue under the skin that is prone to infections. It is more common in patients that are overweight, family history of a pilonidal cyst, deep gluteal cleft and thicker body hair, especially in the region of the gluteal cleft. We discussed that the exact cause of the disease is unknown but could be related to ingrown hairs/ and inflammation in the area coupled with mechanical / shear forces. We discussed that these cysts and sinuses can be surgically removed but that they often recur.  Prior to surgery, options for management include keeping the area clean by removal of hair from the area to decrease bacterial load and prevent trapping of feces in the area, cleansing the area after bowel movements, and daily to twice daily showering as well as keeping the area dry and wicking away moisture from the area if needed with dry gauze replaced often.  Also discussed pulling hair from the pits as this is one cause of the sinus getting clogged and infected.   -The risk and benefits of pilonidal cyst excision were discussed including but not limited to bleeding, infection, injury to surrounding structures, need for additional procedures, and pilonidal cyst recurrence.  After careful consideration, George Jordan has decided to proceed with surgery.  -Patient tentatively scheduled for surgery on 3/20 -Information provided to the patient regarding pilonidal cyst and pilonidal cyst removal -Advised that he needs to call the office or present to an urgent care if he begins to have worsening pain, fever, chills, or purulent drainage from the area  All questions were answered to the satisfaction of the patient  and family.  Note: Portions of this report may have been transcribed using voice recognition software. Every effort has been made to ensure accuracy; however, inadvertent computerized transcription errors may still be present.   Theophilus Kinds, DO [facility] [address] [place], [place] [address] [phone] (office)

## 2023-08-03 NOTE — Patient Instructions (Signed)
 George Jordan  08/03/2023     @PREFPERIOPPHARMACY @   Your procedure is scheduled on  08/06/2023.   Report to [facility] at  0815  A.M.   Call this number if you have problems the morning of surgery:  [phone]  If you experience any cold or flu symptoms such as cough, fever, chills, shortness of breath, etc. between now and your scheduled surgery, please notify us at the above number.   Remember:  Do not eat after midnight.   You may drink clear liquids until  0615 am on 08/06/2023.   Clear liquids allowed are:                    Water, Juice (No red color; non-citric and without pulp; diabetics please choose diet or no sugar options), Carbonated beverages (diabetics please choose diet or no sugar options), Clear Tea (No creamer, milk, or cream, including half & half and powdered creamer), Black Coffee Only (No creamer, milk or cream, including half & half and powdered creamer), and Clear Sports drink (No red color; diabetics please choose diet or no sugar options)    Take these medicines the morning of surgery with A SIP OF WATER                                                        None.    Do not wear jewelry, make-up or nail polish, including gel polish,  artificial nails, or any other type of covering on natural nails (fingers and  toes).  Do not wear lotions, powders, or perfumes, or deodorant.  Do not shave 48 hours prior to surgery.  Men may shave face and neck.  Do not bring valuables to the hospital.  [facility] is not responsible for any belongings or valuables.  Contacts, dentures or bridgework may not be worn into surgery.  Leave your suitcase in the car.  After surgery it may be brought to your room.  For patients admitted to the hospital, discharge time will be determined by your treatment team.  Patients discharged the day of surgery will not be allowed to drive home and must have someone with them for 24 hours.    Special instructions:   DO NOT  smoke tobacco or vape for 24 hours before your procedure.  Please read over the following fact sheets that you were given. Coughing and Deep Breathing, Surgical Site Infection Prevention, Anesthesia Post-op Instructions, and Care and Recovery After Surgery       Pilonidal Cyst Drainage, Care After The following information offers guidance on how to care for yourself after your procedure. Your health care provider may also give you more specific instructions. If you have problems or questions, contact your health care provider. What can I expect after the procedure? After the procedure, it is common to have: Pain that gets better when you take medicine. Some fluid or blood coming from your wound. Follow these instructions at home: Medicines Take over-the-counter and prescription medicines only as told by your health care provider. If you were prescribed antibiotics, take them as told by your health care provider. Do not stop using the antibiotic even if you start to feel better. Ask your health care provider if the medicine prescribed to you: Requires you to  avoid driving or using machinery. Can cause constipation. You may need to take these actions to prevent or treat constipation: Drink enough fluid to keep your urine pale yellow. Take over-the-counter or prescription medicines. Eat foods that are high in fiber, such as beans, whole grains, and fresh fruits and vegetables. Limit foods that are high in fat and processed sugars, such as fried or sweet foods. Bathing Do not take baths or showers, swim, or use a hot tub until your health care provider approves. You may only be allowed to take sponge baths. When you can return to bathing will depend on the type of wound that you have. If your wound was packed with a germ-free packing material, keep the area dry until your packing has been removed. After the packing has been removed, you may start taking showers when your health care provider  approves. If the edges of the incision around your wound were stitched to your skin (marsupialization), you may start taking showers the day after surgery, or when your health care provider approves. Let the water from the shower moisten your bandage (dressing). This will make it easier to take off. Remove your dressing before you shower. While bathing, clean your buttocks area gently with soap and water. After bathing: Pat the area dry with a soft, clean towel. If directed, cover the area with a clean dressing. Wound care  You may need to have a caregiver help you with wound care and dressing changes. Follow instructions from your health care provider about how to take care of your wound. Make sure you: Wash your hands with soap and water for at least 20 seconds before and after you change your dressing. If soap and water are not available, use hand sanitizer. Change your dressing as told by your health care provider. Leave stitches (sutures), skin glue, or adhesive strips in place. These skin closures may need to stay in place for 2 weeks or longer. If adhesive strip edges start to loosen and curl up, you may trim the loose edges. Do not remove adhesive strips completely unless your health care provider tells you to do that. Check your wound every day for signs of infection. Check for: Redness, swelling, or more pain. More fluid or blood. Warmth. Pus or a bad smell. Follow any additional instructions from your health care provider on how to care for your wound, such as wound cleaning, wound flushing (irrigation), or packing your wound with a dressing. If you had marsupialization, ask your health care provider when you can stop using a dressing. Lifestyle Do not do activities that irritate or put pressure on your buttocks for about 2 weeks, or as long as told by your health care provider. These activities include bike riding, running, and anything that involves a twisting motion. Rest as told  by your health care provider. Do not sit for a long time without moving. Get up to take short walks every 1-2 hours. This will improve blood flow and breathing. Ask for help if you feel weak or unsteady. Sleep on your side instead of your back. Avoid wearing tight underwear and tight pants. Return to your normal activities as told by your health care provider. Ask your health care provider what activities are safe for you. General instructions Do not use any products that contain nicotine or tobacco. These products include cigarettes, chewing tobacco, and vaping devices, such as e-cigarettes. These can delay wound healing after surgery. If you need help quitting, ask your health care provider.  Keep all follow-up visits. This is important to monitor healing. If you had an incision and drainage procedure with wound packing, your packing may be changed or removed at follow-up visits. Contact a health care provider if: You have pain that does not get better with medicine. You have any of these signs of infection: More redness, swelling, or pain around your incision. More fluid or blood coming from your incision. Warmth coming from your incision. Pus or a bad smell coming from your incision. A fever. You have muscle aches. You feel generally sick. You are dizzy. Summary A pilonidal cyst is a fluid-filled sac that forms under the skin near the tailbone. It is common to have some fluid or blood coming from your wound after a procedure to drain a pilonidal cyst. If you were prescribed antibiotics, take them as told by your health care provider. Do not stop taking the antibiotic even if you start to feel better. You may need to have a caregiver help you with wound care and dressing changes. Return to your health care provider as instructed to have any packing material changed or removed. This information is not intended to replace advice given to you by your health care provider. Make sure you discuss  any questions you have with your health care provider. Document Revised: 07/31/2021 Document Reviewed: 07/31/2021 Elsevier Patient Education  2024 [employer] Inc.General Anesthesia, Adult, Care After The following information offers guidance on how to care for yourself after your procedure. Your health care provider may also give you more specific instructions. If you have problems or questions, contact your health care provider. What can I expect after the procedure? After the procedure, it is common for people to: Have pain or discomfort at the IV site. Have nausea or vomiting. Have a sore throat or hoarseness. Have trouble concentrating. Feel cold or chills. Feel weak, sleepy, or tired (fatigue). Have soreness and body aches. These can affect parts of the body that were not involved in surgery. Follow these instructions at home: For the time period you were told by your health care provider:  Rest. Do not participate in activities where you could fall or become injured. Do not drive or use machinery. Do not drink alcohol. Do not take sleeping pills or medicines that cause drowsiness. Do not make important decisions or sign legal documents. Do not take care of children on your own. General instructions Drink enough fluid to keep your urine pale yellow. If you have sleep apnea, surgery and certain medicines can increase your risk for breathing problems. Follow instructions from your health care provider about wearing your sleep device: Anytime you are sleeping, including during daytime naps. While taking prescription pain medicines, sleeping medicines, or medicines that make you drowsy. Return to your normal activities as told by your health care provider. Ask your health care provider what activities are safe for you. Take over-the-counter and prescription medicines only as told by your health care provider. Do not use any products that contain nicotine or tobacco. These products  include cigarettes, chewing tobacco, and vaping devices, such as e-cigarettes. These can delay incision healing after surgery. If you need help quitting, ask your health care provider. Contact a health care provider if: You have nausea or vomiting that does not get better with medicine. You vomit every time you eat or drink. You have pain that does not get better with medicine. You cannot urinate or have bloody urine. You develop a skin rash. You have a fever. Get help  right away if: You have trouble breathing. You have chest pain. You vomit blood. These symptoms may be an emergency. Get help right away. Call 911. Do not wait to see if the symptoms will go away. Do not drive yourself to the hospital. Summary After the procedure, it is common to have a sore throat, hoarseness, nausea, vomiting, or to feel weak, sleepy, or fatigue. For the time period you were told by your health care provider, do not drive or use machinery. Get help right away if you have difficulty breathing, have chest pain, or vomit blood. These symptoms may be an emergency. This information is not intended to replace advice given to you by your health care provider. Make sure you discuss any questions you have with your health care provider. Document Revised: 08/02/2021 Document Reviewed: 08/02/2021 Elsevier Patient Education  2024 Else[employer] Inc.How to Use Chlorhexidine at Home in the Shower Chlorhexidine gluconate (CHG) is a germ-killing (antiseptic) wash that's used to clean the skin. It can get rid of the germs that normally live on the skin and can keep them away for about 24 hours. If you're having surgery, you may be told to shower with CHG at home the night before surgery. This can help lower your risk for infection. To use CHG wash in the shower, follow the steps below. Supplies needed: CHG body wash. Clean washcloth. Clean towel. How to use CHG in the shower Follow these steps unless you're told to use CHG  in a different way: Start the shower. Use your normal soap and shampoo to wash your face and hair. Turn off the shower or move out of the shower stream. Pour CHG onto a clean washcloth. Do not use any type of brush or rough sponge. Start at your neck, washing your body down to your toes. Make sure you: Wash the part of your body where the surgery will be done for at least 1 minute. Do not scrub. Do not use CHG on your head or face unless your health care provider tells you to. If it gets into your ears or eyes, rinse them well with water. Do not wash your genitals with CHG. Wash your back and under your arms. Make sure to wash skin folds. Let the CHG sit on your skin for 1-2 minutes or as long as told. Rinse your entire body in the shower, including all body creases and folds. Turn off the shower. Dry off with a clean towel. Do not put anything on your skin afterward, such as powder, lotion, or perfume. Put on clean clothes or pajamas. If it's the night before surgery, sleep in clean sheets. General tips Use CHG only as told, and follow the instructions on the label. Use the full amount of CHG as told. This is often one bottle. Do not smoke and stay away from flames after using CHG. Your skin may feel sticky after using CHG. This is normal. The sticky feeling will go away as the CHG dries. Do not use CHG: If you have a chlorhexidine allergy or have reacted to chlorhexidine in the past. On open wounds or areas of skin that have broken skin, cuts, or scrapes. On babies younger than 21 months of age. Contact a health care provider if: You have questions about using CHG. Your skin gets irritated or itchy. You have a rash after using CHG. You swallow any CHG. Call your local poison control center 9198364659 in the [place] Your eyes itch badly, or they become very red or  swollen. Your hearing changes. You have trouble seeing. If you can't reach your provider, go to an urgent care or  emergency room. Do not drive yourself. Get help right away if: You have swelling or tingling in your mouth or throat. You make high-pitched whistling sounds when you breathe, most often when you breathe out (wheeze). You have trouble breathing. These symptoms may be an emergency. Call 911 right away. Do not wait to see if the symptoms will go away. Do not drive yourself to the hospital. This information is not intended to replace advice given to you by your health care provider. Make sure you discuss any questions you have with your health care provider. Document Revised: 11/18/2022 Document Reviewed: 11/14/2021 Elsevier Patient Education  2024 [employer].

## 2023-08-04 ENCOUNTER — Other Ambulatory Visit (HOSPITAL_COMMUNITY)

## 2023-08-05 ENCOUNTER — Encounter (HOSPITAL_COMMUNITY)
Admission: RE | Admit: 2023-08-05 | Discharge: 2023-08-05 | Disposition: A | Discharge status: Home / Self Care | Source: Ambulatory Visit | Attending: Surgery | Admitting: Surgery

## 2023-08-05 ENCOUNTER — Other Ambulatory Visit: Payer: Self-pay

## 2023-08-05 ENCOUNTER — Encounter (HOSPITAL_COMMUNITY): Payer: Self-pay

## 2023-08-05 VITALS — BP 146/87 | HR 94 | Temp 98.0°F | Resp 16 | Ht 68.5 in | Wt 259.0 lb

## 2023-08-05 DIAGNOSIS — I498 Other specified cardiac arrhythmias: Secondary | ICD-10-CM | POA: Diagnosis not present

## 2023-08-05 DIAGNOSIS — Z01818 Encounter for other preprocedural examination: Secondary | ICD-10-CM | POA: Diagnosis present

## 2023-08-05 DIAGNOSIS — F17200 Nicotine dependence, unspecified, uncomplicated: Secondary | ICD-10-CM | POA: Diagnosis not present

## 2023-08-05 DIAGNOSIS — R638 Other symptoms and signs concerning food and fluid intake: Secondary | ICD-10-CM | POA: Diagnosis not present

## 2023-08-06 ENCOUNTER — Ambulatory Visit (HOSPITAL_COMMUNITY): Payer: Self-pay

## 2023-08-06 ENCOUNTER — Encounter (HOSPITAL_COMMUNITY)
Admission: RE | Disposition: A | Discharge status: Home / Self Care | Payer: Self-pay | Source: Home / Self Care | Attending: Surgery

## 2023-08-06 ENCOUNTER — Encounter (HOSPITAL_COMMUNITY): Payer: Self-pay | Admitting: Surgery

## 2023-08-06 ENCOUNTER — Ambulatory Visit (HOSPITAL_COMMUNITY)
Admission: RE | Admit: 2023-08-06 | Discharge: 2023-08-06 | Disposition: A | Discharge status: Home / Self Care | Attending: Surgery | Admitting: Surgery

## 2023-08-06 DIAGNOSIS — F17200 Nicotine dependence, unspecified, uncomplicated: Secondary | ICD-10-CM

## 2023-08-06 DIAGNOSIS — L0591 Pilonidal cyst without abscess: Secondary | ICD-10-CM

## 2023-08-06 DIAGNOSIS — R638 Other symptoms and signs concerning food and fluid intake: Secondary | ICD-10-CM

## 2023-08-06 DIAGNOSIS — F17210 Nicotine dependence, cigarettes, uncomplicated: Secondary | ICD-10-CM | POA: Insufficient documentation

## 2023-08-06 HISTORY — PX: PILONIDAL CYST EXCISION: SHX744

## 2023-08-06 SURGERY — EXCISION, SIMPLE PILONIDAL CYST
Anesthesia: General | Site: Buttocks

## 2023-08-06 MED ORDER — FENTANYL CITRATE (PF) 100 MCG/2ML IJ SOLN: INTRAMUSCULAR | Status: DC | PRN

## 2023-08-06 MED ORDER — CLINDAMYCIN PHOSPHATE 900 MG/50ML IV SOLN
INTRAVENOUS | Status: DC | PRN
  Administered 2023-08-06: 900 mg via INTRAVENOUS

## 2023-08-06 MED ORDER — MIDAZOLAM HCL 2 MG/2ML IJ SOLN
INTRAMUSCULAR | Status: AC
Start: 2023-08-06 — End: ?
  Filled 2023-08-06: qty 2

## 2023-08-06 MED ORDER — ORAL CARE MOUTH RINSE: 15.0000 mL | Freq: Once | OROMUCOSAL | Status: AC

## 2023-08-06 MED ORDER — BACITRACIN-NEOMYCIN-POLYMYXIN OINTMENT TUBE
TOPICAL_OINTMENT | CUTANEOUS | Status: DC | PRN
  Administered 2023-08-06: 2 via TOPICAL

## 2023-08-06 MED ORDER — ONDANSETRON HCL 4 MG PO TABS: 4.0000 mg | ORAL_TABLET | Freq: Every day | ORAL | 1 refills | Status: DC | PRN

## 2023-08-06 MED ORDER — FENTANYL CITRATE (PF) 100 MCG/2ML IJ SOLN
INTRAMUSCULAR | Status: DC | PRN
  Administered 2023-08-06: 50 ug via INTRAVENOUS
  Administered 2023-08-06: 100 ug via INTRAVENOUS

## 2023-08-06 MED ORDER — FENTANYL CITRATE (PF) 100 MCG/2ML IJ SOLN
INTRAMUSCULAR | Status: AC
Start: 2023-08-06 — End: ?
  Filled 2023-08-06: qty 2

## 2023-08-06 MED ORDER — ACETAMINOPHEN 160 MG/5ML PO SOLN
960.0000 mg | Freq: Once | ORAL | Status: AC
  Filled 2023-08-06: qty 30

## 2023-08-06 MED ORDER — LACTATED RINGERS IV SOLN: INTRAVENOUS | Status: DC

## 2023-08-06 MED ORDER — SUGAMMADEX SODIUM 200 MG/2ML IV SOLN
INTRAVENOUS | Status: DC | PRN
Start: 2023-08-06 — End: 2023-08-06
  Administered 2023-08-06: 400 mg via INTRAVENOUS

## 2023-08-06 MED ORDER — FENTANYL CITRATE (PF) 100 MCG/2ML IJ SOLN
INTRAMUSCULAR | Status: AC
  Filled 2023-08-06: qty 2

## 2023-08-06 MED ORDER — CLINDAMYCIN PHOSPHATE 900 MG/50ML IV SOLN: 900.0000 mg | INTRAVENOUS | Status: DC

## 2023-08-06 MED ORDER — DOCUSATE SODIUM 100 MG PO CAPS
100.0000 mg | ORAL_CAPSULE | Freq: Two times a day (BID) | ORAL | 2 refills | Status: DC
Start: 2023-08-06 — End: 2023-08-19

## 2023-08-06 MED ORDER — CHLORHEXIDINE GLUCONATE 0.12 % MT SOLN
15.0000 mL | Freq: Once | OROMUCOSAL | Status: AC
  Administered 2023-08-06: 15 mL via OROMUCOSAL

## 2023-08-06 MED ORDER — TRIPLE ANTIBIOTIC 3.5-400-5000 EX OINT
TOPICAL_OINTMENT | CUTANEOUS | Status: AC
  Filled 2023-08-06: qty 2

## 2023-08-06 MED ORDER — CHLORHEXIDINE GLUCONATE CLOTH 2 % EX PADS: 6.0000 | MEDICATED_PAD | Freq: Once | CUTANEOUS | Status: DC

## 2023-08-06 MED ORDER — MIDAZOLAM HCL 5 MG/5ML IJ SOLN: INTRAMUSCULAR | Status: DC | PRN

## 2023-08-06 MED ORDER — FENTANYL CITRATE PF 50 MCG/ML IJ SOSY: 25.0000 ug | PREFILLED_SYRINGE | INTRAMUSCULAR | Status: DC | PRN

## 2023-08-06 MED ORDER — DEXMEDETOMIDINE HCL IN NACL 80 MCG/20ML IV SOLN
INTRAVENOUS | Status: DC | PRN
  Administered 2023-08-06 (×2): 8 ug via INTRAVENOUS

## 2023-08-06 MED ORDER — ROCURONIUM BROMIDE 10 MG/ML (PF) SYRINGE
PREFILLED_SYRINGE | INTRAVENOUS | Status: DC | PRN
  Administered 2023-08-06: 40 mg via INTRAVENOUS
  Administered 2023-08-06: 10 mg via INTRAVENOUS

## 2023-08-06 MED ORDER — BUPIVACAINE HCL (PF) 0.5 % IJ SOLN
INTRAMUSCULAR | Status: DC | PRN
  Administered 2023-08-06: 30 mL

## 2023-08-06 MED ORDER — CLINDAMYCIN PHOSPHATE 900 MG/50ML IV SOLN
INTRAVENOUS | Status: AC
  Filled 2023-08-06: qty 50

## 2023-08-06 MED ORDER — SUCCINYLCHOLINE CHLORIDE 200 MG/10ML IV SOSY
PREFILLED_SYRINGE | INTRAVENOUS | Status: DC | PRN
  Administered 2023-08-06: 160 mg via INTRAVENOUS

## 2023-08-06 MED ORDER — CHLORHEXIDINE GLUCONATE CLOTH 2 % EX PADS
6.0000 | MEDICATED_PAD | Freq: Once | CUTANEOUS | Status: AC
  Administered 2023-08-06: 6 via TOPICAL

## 2023-08-06 MED ORDER — PROPOFOL 10 MG/ML IV BOLUS
INTRAVENOUS | Status: AC
  Filled 2023-08-06: qty 20

## 2023-08-06 MED ORDER — BUPIVACAINE HCL (PF) 0.5 % IJ SOLN
INTRAMUSCULAR | Status: AC
  Filled 2023-08-06: qty 30

## 2023-08-06 MED ORDER — PROPOFOL 10 MG/ML IV BOLUS
INTRAVENOUS | Status: DC | PRN
  Administered 2023-08-06: 200 mg via INTRAVENOUS

## 2023-08-06 MED ORDER — OXYCODONE HCL 5 MG PO TABS: 5.0000 mg | ORAL_TABLET | Freq: Four times a day (QID) | ORAL | 0 refills | Status: DC | PRN

## 2023-08-06 MED ORDER — MIDAZOLAM HCL 5 MG/5ML IJ SOLN
INTRAMUSCULAR | Status: DC | PRN
  Administered 2023-08-06: 2 mg via INTRAVENOUS

## 2023-08-06 MED ORDER — SODIUM CHLORIDE 0.9 % IR SOLN
Status: DC | PRN
  Administered 2023-08-06: 1000 mL

## 2023-08-06 MED ORDER — SUGAMMADEX SODIUM 200 MG/2ML IV SOLN
INTRAVENOUS | Status: AC
  Filled 2023-08-06: qty 4

## 2023-08-06 MED ORDER — ACETAMINOPHEN 500 MG PO TABS
ORAL_TABLET | ORAL | Status: AC
  Filled 2023-08-06: qty 2

## 2023-08-06 MED ORDER — KETOROLAC TROMETHAMINE 30 MG/ML IJ SOLN
INTRAMUSCULAR | Status: DC | PRN
  Administered 2023-08-06: 15 mg via INTRAVENOUS

## 2023-08-06 MED ORDER — SEVOFLURANE IN SOLN
RESPIRATORY_TRACT | Status: AC
  Filled 2023-08-06: qty 250

## 2023-08-06 MED ORDER — LIDOCAINE HCL (CARDIAC) PF 100 MG/5ML IV SOSY
PREFILLED_SYRINGE | INTRAVENOUS | Status: DC | PRN
  Administered 2023-08-06: 100 mg via INTRAVENOUS

## 2023-08-06 MED ORDER — DEXAMETHASONE SODIUM PHOSPHATE 10 MG/ML IJ SOLN
INTRAMUSCULAR | Status: DC | PRN
Start: 2023-08-06 — End: 2023-08-06
  Administered 2023-08-06: 10 mg via INTRAVENOUS

## 2023-08-06 MED ORDER — OXYCODONE HCL 5 MG PO TABS: 5.0000 mg | ORAL_TABLET | Freq: Once | ORAL | Status: DC | PRN

## 2023-08-06 MED ORDER — SODIUM CHLORIDE 0.9 % IV SOLN: 12.5000 mg | INTRAVENOUS | Status: DC | PRN

## 2023-08-06 MED ORDER — ONDANSETRON HCL 4 MG/2ML IJ SOLN
INTRAMUSCULAR | Status: DC | PRN
  Administered 2023-08-06: 4 mg via INTRAVENOUS

## 2023-08-06 MED ORDER — TRIPLE ANTIBIOTIC 3.5-400-5000 EX OINT
TOPICAL_OINTMENT | CUTANEOUS | Status: AC
  Filled 2023-08-06: qty 1

## 2023-08-06 MED ORDER — OXYCODONE HCL 5 MG/5ML PO SOLN: 5.0000 mg | Freq: Once | ORAL | Status: DC | PRN

## 2023-08-06 MED ORDER — ACETAMINOPHEN 500 MG PO TABS: 1000.0000 mg | ORAL_TABLET | Freq: Four times a day (QID) | ORAL | 0 refills | Status: AC

## 2023-08-06 MED ORDER — ACETAMINOPHEN 500 MG PO TABS
1000.0000 mg | ORAL_TABLET | Freq: Once | ORAL | Status: AC
  Administered 2023-08-06: 1000 mg via ORAL

## 2023-08-06 MED ORDER — LACTATED RINGERS IV SOLN: INTRAVENOUS | Status: DC | PRN

## 2023-08-06 SURGICAL SUPPLY — 37 items
CANNULA VESSEL 3MM 2 BLNT TIP (CANNULA) ×2 IMPLANT
CLOTH BEACON ORANGE TIMEOUT ST (SAFETY) ×1 IMPLANT
COVER LIGHT HANDLE STERIS (MISCELLANEOUS) ×2 IMPLANT
ELECT NDL TIP 2.8 STRL (NEEDLE) IMPLANT
ELECT NEEDLE TIP 2.8 STRL (NEEDLE) ×1 IMPLANT
ELECT REM PT RETURN 9FT ADLT (ELECTROSURGICAL) ×1 IMPLANT
ELECTRODE REM PT RTRN 9FT ADLT (ELECTROSURGICAL) ×1 IMPLANT
GAUZE SPONGE 4X4 12PLY STRL (GAUZE/BANDAGES/DRESSINGS) ×2 IMPLANT
GAUZE XEROFORM 5X9 LF (GAUZE/BANDAGES/DRESSINGS) IMPLANT
GLOVE BIOGEL PI IND STRL 6.5 (GLOVE) ×1 IMPLANT
GLOVE BIOGEL PI IND STRL 7.0 (GLOVE) ×2 IMPLANT
GLOVE SURG SS PI 6.5 STRL IVOR (GLOVE) ×1 IMPLANT
GOWN STRL REUS W/TWL LRG LVL3 (GOWN DISPOSABLE) ×2 IMPLANT
KIT TURNOVER KIT A (KITS) ×1 IMPLANT
MANIFOLD NEPTUNE II (INSTRUMENTS) ×1 IMPLANT
NDL HYPO 21X1.5 SAFETY (NEEDLE) ×1 IMPLANT
NEEDLE HYPO 21X1.5 SAFETY (NEEDLE) ×1 IMPLANT
NS IRRIG 1000ML POUR BTL (IV SOLUTION) ×1 IMPLANT
PACK MINOR (CUSTOM PROCEDURE TRAY) ×1 IMPLANT
PAD ABD 5X9 TENDERSORB (GAUZE/BANDAGES/DRESSINGS) ×1 IMPLANT
PAD ARMBOARD POSITIONER FOAM (MISCELLANEOUS) ×2 IMPLANT
PAD TELFA 3X4 1S STER (GAUZE/BANDAGES/DRESSINGS) ×1 IMPLANT
PENCIL SMOKE EVACUATOR (MISCELLANEOUS) ×1 IMPLANT
POSITIONER HEAD 8X9X4 ADT (SOFTGOODS) ×1 IMPLANT
SET BASIN LINEN APH (SET/KITS/TRAYS/PACK) ×1 IMPLANT
SOL PREP POV-IOD 4OZ 10% (MISCELLANEOUS) ×1 IMPLANT
SOL SCRUB PVP POV-IOD 4OZ 7.5% (MISCELLANEOUS) ×1 IMPLANT
SOLUTION SCRB POV-IOD 4OZ 7.5% (MISCELLANEOUS) ×1 IMPLANT
SPONGE T-LAP 18X18 ~~LOC~~+RFID (SPONGE) ×1 IMPLANT
SUT ETHILON 3 0 PS 1 (SUTURE) IMPLANT
SUT VIC AB 2-0 SH 27X BRD (SUTURE) IMPLANT
SUT VIC AB 3-0 SH 27X BRD (SUTURE) ×1 IMPLANT
SYR 30ML LL (SYRINGE) ×2 IMPLANT
SYR CONTROL 10ML LL (SYRINGE) ×1 IMPLANT
TAPE CLOTH SURG 4X10 WHT LF (GAUZE/BANDAGES/DRESSINGS) ×1 IMPLANT
TOWEL OR 17X26 4PK STRL BLUE (TOWEL DISPOSABLE) ×1 IMPLANT
TUBE SUCTION HIGH CAP CLEAR NV (SUCTIONS) IMPLANT

## 2023-08-06 NOTE — Anesthesia Preprocedure Evaluation (Signed)
 Anesthesia Evaluation  Patient identified by MRN, date of birth, ID band Patient awake    Reviewed: Allergy & Precautions, H&P , NPO status , Patient's Chart, lab work & pertinent test results, reviewed documented beta blocker date and time   Airway Mallampati: II  TM Distance: >3 FB Neck ROM: full    Dental no notable dental hx. (+) Dental Advisory Given, Teeth Intact   Pulmonary Current Smoker Stop bang 3   Pulmonary exam normal breath sounds clear to auscultation       Cardiovascular Exercise Tolerance: Good negative cardio ROS Normal cardiovascular exam Rhythm:regular Rate:Normal     Neuro/Psych negative neurological ROS  negative psych ROS   GI/Hepatic negative GI ROS, Neg liver ROS,,,  Endo/Other  negative endocrine ROS    Renal/GU negative Renal ROS  negative genitourinary   Musculoskeletal   Abdominal   Peds  Hematology negative hematology ROS (+)   Anesthesia Other Findings   Reproductive/Obstetrics negative OB ROS                             Anesthesia Physical Anesthesia Plan  ASA: 2  Anesthesia Plan: General   Post-op Pain Management: Dilaudid IV   Induction:   PONV Risk Score and Plan: Ondansetron, Dexamethasone and Midazolam  Airway Management Planned: LMA  Additional Equipment: None  Intra-op Plan:   Post-operative Plan: Extubation in OR  Informed Consent: I have reviewed the patients History and Physical, chart, labs and discussed the procedure including the risks, benefits and alternatives for the proposed anesthesia with the patient or authorized representative who has indicated his/her understanding and acceptance.     Dental Advisory Given  Plan Discussed with: CRNA  Anesthesia Plan Comments:        Anesthesia Quick Evaluation

## 2023-08-06 NOTE — Interval H&P Note (Signed)
 History and Physical Interval Note:  08/06/2023 9:43 AM  George Jordan  has presented today for surgery, with the diagnosis of PILONIDAL CYST.  The various methods of treatment have been discussed with the patient and family. After consideration of risks, benefits and other options for treatment, the patient has consented to  Procedure(s): EXCISION, SIMPLE PILONIDAL CYST (N/A) as a surgical intervention.  The patient's history has been reviewed, patient examined, no change in status, stable for surgery.  I have reviewed the patient's chart and labs.  Questions were answered to the patient's satisfaction.     [name] A Jessalynn Mccowan

## 2023-08-06 NOTE — Discharge Instructions (Signed)
 Ambulatory Surgery Discharge Instructions  General Anesthesia or Sedation Do not drive or operate heavy machinery for 24 hours.  Do not consume alcohol, tranquilizers, sleeping medications, or any non-prescribed medications for 24 hours. Do not make important decisions or sign any important papers in the next 24 hours. You should have someone with you tonight at home.  Activity  You are advised to go directly home from the hospital.  Restrict your activities and rest for a day.  Resume light activity tomorrow. No heavy lifting over 10 lbs or strenuous exercise.  Fluids and Diet Begin with clear liquids, bouillon, dry toast, soda crackers.  If not nauseated, you may go to a regular diet when you desire.  Greasy and spicy foods are not advised.  Medications  If you have not had a bowel movement in 24 hours, take 2 tablespoons over the counter Milk of mag.             You May resume your blood thinners tomorrow (Aspirin, coumadin, or other).  You are being discharged with prescriptions for Opioid/Narcotic Medications: There are some specific considerations for these medications that you should know. Opioid Meds have risks & benefits. Addiction to these meds is always a concern with prolonged use Take medication only as directed Do not drive while taking narcotic pain medication Do not crush tablets or capsules Do not use a different container than medication was dispensed in Lock the container of medication in a cool, dry place out of reach of children and pets. Opioid medication can cause addiction Do not share with anyone else (this is a felony) Do not store medications for future use. Dispose of them properly.     Disposal:  Find a [employer] household drug take back site near you.  If you can't get to a drug take back site, use the recipe below as a last resort to dispose of expired, unused or unwanted drugs. Disposal  (Do not dispose chemotherapy drugs this way, talk to your  prescribing doctor instead.) Step 1: Mix drugs (do not crush) with dirt, kitty litter, or used coffee grounds and add a small amount of water to dissolve any solid medications. Step 2: Seal drugs in plastic bag. Step 3: Place plastic bag in trash. Step 4: Take prescription container and scratch out personal information, then recycle or throw away.  Operative Site  You have external stitches in place.  These will be removed at your follow up appointment. Keep the area covered.  Ok to English as a second language teacher. Keep wound clean and dry. No baths or swimming. No lifting more than 10 pounds.  Contact Information: If you have questions or concerns, please call our office, [phone], Monday- Thursday 8AM-5PM and Friday 8AM-12Noon.  If it is after hours or on the weekend, please call [facility], [phone], and ask to speak to the surgeon on call for Dr. Robyne Peers at [facility].   SPECIFIC COMPLICATIONS TO WATCH FOR: Inability to urinate Fever over 101? F by mouth Nausea and vomiting lasting longer than 24 hours. Pain not relieved by medication ordered Swelling around the operative site Increased redness, warmth, hardness, around operative area Numbness, tingling, or cold fingers or toes Blood -soaked dressing, (small amounts of oozing may be normal) Increasing and progressive drainage from surgical area or exam site

## 2023-08-06 NOTE — Op Note (Addendum)
 [facility] Operative Note  08/06/23  Preoperative Diagnosis: Pilonidal Cyst    Postoperative Diagnosis: Extensive pilonidal cyst   Procedure(s) Performed:  Complex Pilonidal cyst excision   Surgeon: Theophilus Kinds, DO    Assistants: Allyne Gee, MS3    Anesthesia: General endotracheal   Anesthesiologist: Ronelle Nigh, MD    Specimens: pilonidal cyst   Estimated Blood Loss: 20 cc   Blood Replacement: None    Complications: None   Wound Class: Contaminated   Operative Indications: Patient is a 23 year old male who presents for pilonidal cyst excision.  He has had numerous I&Ds to the site previously, and he would like the area excised at this time.    All risks and benefits of performing this procedure were discussed with the patient including pain, infection, bleeding, damage to the surrounding structures, need for more procedures or surgery, and pilonidal cyst recurrence. The patient voiced understanding of the procedure, all questions were sought and answered, and consent was obtained.  Findings: Extensive pilonidal cyst with multiple sinus tracts within the subcutaneous tissues and tracking to pits at the skin, noted midline Active inflammation noted of the cyst and surrounding tissues without evidence of infection   Procedure: The patient was taken to the operating room and placed supine. General endotracheal anesthesia was induced. Intravenous antibiotics were administered per protocol.  The patient was then positioned in the prone position and all pressure points were padded. A JACHO approved timeout was performed. The gluteal cleft and buttocks were prepared and draped in the usual sterile fashion. The buttock were taped open to allow for adequate visualization.   A elliptical incision was made surrounding the pilonidal cyst. Using electrocautery, the dermis and subcutaneous tissues dissected around the cyst.  Upon further dissection, the  cyst was noted to have numerous sinus tracts within the subcutaneous tissue with tracking to skin pits.  A larger area of skin was excised to include these involved pits. The cyst had active inflammation noted without infection.  The cyst was removed in its entirety down to the presacral fascia, and sent to pathology for evaluation. Hemostasis was achieved using electrocautery. The deep tissue was approximated using 2-0 Vicryl in an interrupted fashion.  The dermis was approximated using 3-0 Vicryl sutures. The skin was closed using 3-0 Nylon in a vertical mattress fashion.  The incision site was dressed with antibiotic ointment, Xeroform, 4x4s, and medipore tape.   All counts were correct at the end of the case. The patient was awakened from anesthesia and extubated without complication.  The patient went to the PACU in stable condition.  Theophilus Kinds, DO  [facility] [address] [place] [place], [place] [address] [phone] (office)

## 2023-08-06 NOTE — Progress Notes (Signed)
 [facility]  Spoke with the patient's significant other in the consultation room.  I explained that he tolerated the procedure without difficulty.  He has external stitches in place that will be removed at his follow up visit.  I discharged him home with a prescription for narcotic pain medication that they should take as needed for pain.  I also want him taking scheduled Tylenol.  If they take the narcotic pain medication, they should take a stool softener as well.  The patient will follow-up with me in 2 weeks.  All questions were answered to her expressed satisfaction.  Theophilus Kinds, DO [facility] [address] [place] [place], [place] [address] [phone] (office)

## 2023-08-06 NOTE — Transfer of Care (Signed)
 Immediate Anesthesia Transfer of Care Note  Patient: George Jordan  Procedure(s) Performed: EXCISION, COMPLEX PILONIDAL CYST (Buttocks)  Patient Location: PACU  Anesthesia Type:General  Level of Consciousness: awake, alert , oriented, and patient cooperative  Airway & Oxygen Therapy: Patient Spontanous Breathing  Post-op Assessment: Report given to RN, Post -op Vital signs reviewed and stable, and Patient moving all extremities X 4  Post vital signs: Reviewed and stable  Last Vitals:  Vitals Value Taken Time  BP 131/66 08/06/23 1216  Temp 36.5 C 08/06/23 1216  Pulse 94 08/06/23 1219  Resp 19 08/06/23 1219  SpO2 95 % 08/06/23 1219  Vitals shown include unfiled device data.  Last Pain:  Vitals:   08/06/23 0921  PainSc: 0-No pain         Complications: No notable events documented.

## 2023-08-06 NOTE — ED Notes (Signed)
  August 06, 2023  Patient: George Jordan  Date of Birth: 2001/04/21  Date of Visit: 08/06/2023    To Whom It May Concern:  Rollins Wrightson was seen and treated in our short stay surgery center on 08/06/2023. SKYLOR SCHNAPP  may return to work on 08/20/2023 .  Sincerely,   Dorrene German, RN

## 2023-08-06 NOTE — Anesthesia Postprocedure Evaluation (Signed)
 Anesthesia Post Note  Patient: George Jordan  Procedure(s) Performed: EXCISION, COMPLEX PILONIDAL CYST (Buttocks)  Patient location during evaluation: PACU Anesthesia Type: General Level of consciousness: awake and alert Pain management: pain level controlled Vital Signs Assessment: post-procedure vital signs reviewed and stable Respiratory status: spontaneous breathing, nonlabored ventilation, respiratory function stable and patient connected to nasal cannula oxygen Cardiovascular status: blood pressure returned to baseline and stable Postop Assessment: no apparent nausea or vomiting Anesthetic complications: no   There were no known notable events for this encounter.   Last Vitals:  Vitals:   08/06/23 1216 08/06/23 1230  BP: 131/66 103/67  Pulse: 92 83  Resp: 18 19  Temp: 36.5 C   SpO2: 96% 94%    Last Pain:  Vitals:   08/06/23 1230  PainSc: Asleep                 Shyann Hefner L Egan Berkheimer

## 2023-08-07 ENCOUNTER — Encounter (HOSPITAL_COMMUNITY): Payer: Self-pay | Admitting: Surgery

## 2023-08-07 LAB — SURGICAL PATHOLOGY

## 2023-08-19 ENCOUNTER — Other Ambulatory Visit: Payer: Self-pay

## 2023-08-19 ENCOUNTER — Encounter: Payer: Self-pay | Admitting: Surgery

## 2023-08-19 ENCOUNTER — Ambulatory Visit (INDEPENDENT_AMBULATORY_CARE_PROVIDER_SITE_OTHER): Admitting: Surgery

## 2023-08-19 VITALS — BP 132/81 | HR 86 | Temp 97.9°F | Resp 18 | Ht 68.5 in | Wt 257.0 lb

## 2023-08-19 DIAGNOSIS — Z09 Encounter for follow-up examination after completed treatment for conditions other than malignant neoplasm: Secondary | ICD-10-CM

## 2023-08-19 NOTE — Progress Notes (Unsigned)
 [facility] Note   HPI:  23 y.o. Male presents to clinic for post-op follow-up status post pilonidal cyst excision on 3/20.  Postoperatively the patient has been doing well.  He denies any significant pain associated with the surgical site.  He denies any drainage.  His wife has been performing dressing changes and denies any issues with the incision site.  Denies fevers and chills.  Review of Systems:  All other review of systems: otherwise negative   Vital Signs:  BP 132/81   Pulse 86   Temp 97.9 F (36.6 C) (Oral)   Resp 18   Ht 5' 8.5" (1.74 m)   Wt 257 lb (116.6 kg)   SpO2 97%   BMI 38.51 kg/m    Physical Exam:  Physical Exam Vitals reviewed.  Constitutional:      Appearance: Normal appearance.  Skin:    General: Skin is warm and dry.     Comments: Pilonidal cyst excision site healing well with sutures in place, no surrounding erythema or induration, nontender to palpation  Neurological:     Mental Status: He is alert.     Laboratory studies: None  Imaging:  None  Pathology: A. PILONIDAL CYST, EXCISION:  - Consistent with clinically stated pilonidal cyst    Assessment:  23 y.o. yo Male who presents for follow-up status post pilonidal cyst excision on 3/20  Plan:  -Patient overall doing well since the surgery.  Tolerating a diet, moving his bowels, and pain well-controlled -Most of the patient's sutures were removed.  I did leave the for inferior most sutures on the incision site, as this area still appears to be healing -Advised that there is a risk that the incision where I removed the stitches could open up and have a little bit of drainage -Follow up with me next week for removal of the remaining sutures  All of the above recommendations were discussed with the patient and patient's family, and all of patient's and family's questions were answered to their expressed satisfaction.  Note: Portions of this report may have been transcribed  using voice recognition software. Every effort has been made to ensure accuracy; however, inadvertent computerized transcription errors may still be present.   Theophilus Kinds, DO [facility] [address] [place], [place] [address] [phone] (office)

## 2023-08-27 ENCOUNTER — Encounter: Payer: Self-pay | Admitting: Surgery

## 2023-08-27 ENCOUNTER — Ambulatory Visit (INDEPENDENT_AMBULATORY_CARE_PROVIDER_SITE_OTHER): Admitting: Surgery

## 2023-08-27 VITALS — BP 143/85 | HR 84 | Temp 98.2°F | Resp 16 | Ht 68.5 in | Wt 268.0 lb

## 2023-08-27 DIAGNOSIS — Z09 Encounter for follow-up examination after completed treatment for conditions other than malignant neoplasm: Secondary | ICD-10-CM

## 2023-08-28 NOTE — Progress Notes (Signed)
 [facility] Note   HPI:  23 y.o. Male presents to clinic for post-op follow-up who presents for follow-up status post pilonidal cyst excision on 3/20.  Patient has been doing well since his last visit and denies any significant issues.  He continues to deny pain or any drainage from the area.  He is having regular bowel movements and tolerating diet without nausea and vomiting.  Denies fevers and chills.  Review of Systems:  All other review of systems: otherwise negative   Vital Signs:  BP (!) 143/85   Pulse 84   Temp 98.2 F (36.8 C) (Oral)   Resp 16   Ht 5' 8.5" (1.74 m)   Wt 268 lb (121.6 kg)   SpO2 96%   BMI 40.16 kg/m    Physical Exam:  Physical Exam Vitals reviewed.  Constitutional:      Appearance: Normal appearance.  Skin:    General: Skin is warm and dry.     Comments: Pilonidal cyst excision site healing well with sutures at the inferior aspect of the incision, no surrounding erythema or induration, nontender to palpation  Neurological:     Mental Status: He is alert.     Laboratory studies: None  Imaging:  None  Assessment:  23 y.o. yo Male who presents for follow-up status post pilonidal cyst excision on 3/20  Plan:  - Patient continues to do well since the surgery. - Remainder of the sutures removed from the incision site - We discussed that now that all the sutures are out, there is a small chance that he could develop a wound at the incision site.  Advised that he should call the office if he does note any open areas or increased drainage from the incision site - Follow up as needed  All of the above recommendations were discussed with the patient and patient's family, and all of patient's and family's questions were answered to their expressed satisfaction.  Note: Portions of this report may have been transcribed using voice recognition software. Every effort has been made to ensure accuracy; however, inadvertent computerized  transcription errors may still be present.   Theophilus Kinds, DO [facility] [address] [place], [place] [address] [phone] (office)

## 2023-09-09 ENCOUNTER — Ambulatory Visit (INDEPENDENT_AMBULATORY_CARE_PROVIDER_SITE_OTHER): Admitting: Surgery

## 2023-09-09 ENCOUNTER — Encounter: Payer: Self-pay | Admitting: Surgery

## 2023-09-09 VITALS — BP 140/85 | HR 86 | Temp 98.1°F | Resp 14 | Ht 68.5 in | Wt 260.0 lb

## 2023-09-09 DIAGNOSIS — T8131XA Disruption of external operation (surgical) wound, not elsewhere classified, initial encounter: Secondary | ICD-10-CM

## 2023-09-09 DIAGNOSIS — L7634 Postprocedural seroma of skin and subcutaneous tissue following other procedure: Secondary | ICD-10-CM

## 2023-09-09 NOTE — Progress Notes (Unsigned)
 [facility] Note   HPI:  23 y.o. Male presents to clinic for follow-up evaluation of a small opening and drainage from his pilonidal incision site.  He underwent a pilonidal cyst excision on 3/20.  Starting yesterday, he noted some clear yellow drainage from the incision site, but his fiance did not notice if there was any opening.  He denies any pain.  He denies fevers and chills.  Review of Systems:  All other review of systems: otherwise negative   Vital Signs:  BP (!) 140/85   Pulse 86   Temp 98.1 F (36.7 C) (Oral)   Resp 14   Ht 5' 8.5" (1.74 m)   Wt 260 lb (117.9 kg)   SpO2 97%   BMI 38.96 kg/m    Physical Exam:  Physical Exam Vitals reviewed.  Constitutional:      Appearance: Normal appearance.  Skin:    General: Skin is warm and dry.     Comments: Pilonidal cyst excision site healing well with a palpable underlying seroma, punctate opening at inferior aspect of incision with minimal serous drainage noted (opening is 2 to 3 mm in size)  Neurological:     Mental Status: He is alert.     Laboratory studies: None  Imaging:  None  Assessment:  23 y.o. yo Male who presents for a wound recheck status post pilonidal cyst excision on 3/20.  Plan:  -Discussed with patient that he has a punctate opening at the inferior aspect of his pilonidal incision site.  We discussed that given this opening with a likely underlying seroma, there is a chance this area will continue to drain until the opening heals closed or until the wound cavity closes up and stops producing serous fluid.  I reassured the patient that the area is not infected, and this is a known potential complication after the surgery. -I attempted to drain the seroma under his pilonidal excision site, but was only able to remove about 4 cc of serous drainage -Silver nitrate was applied to the punctate opening -Advised that he needs to call the office if he begins to have worsening pain at this  area, fever, chills, or purulent drainage -Follow up with me next week for wound recheck and possible reapplication of silver nitrate  All of the above recommendations were discussed with the patient and patient's family, and all of patient's and family's questions were answered to their expressed satisfaction.  Note: Portions of this report may have been transcribed using voice recognition software. Every effort has been made to ensure accuracy; however, inadvertent computerized transcription errors may still be present.   Lidia Reels, DO [facility] [address] [place], [place] [address] [phone] (office)

## 2023-09-15 ENCOUNTER — Ambulatory Visit (INDEPENDENT_AMBULATORY_CARE_PROVIDER_SITE_OTHER): Admitting: Surgery

## 2023-09-15 ENCOUNTER — Encounter: Payer: Self-pay | Admitting: Surgery

## 2023-09-15 VITALS — BP 129/80 | HR 71 | Temp 98.1°F | Resp 14 | Ht 68.5 in | Wt 260.0 lb

## 2023-09-15 DIAGNOSIS — T8131XD Disruption of external operation (surgical) wound, not elsewhere classified, subsequent encounter: Secondary | ICD-10-CM

## 2023-09-20 NOTE — Progress Notes (Unsigned)
 [facility] Note   HPI:  23 y.o. Male presents to clinic for follow-up evaluation of a small opening and drainage from his pilonidal incision site.  He underwent a pilonidal cyst excision on 3/20.  Starting yesterday, he noted some clear yellow drainage from the incision site, but his fiance did not notice if there was any opening.  He denies any pain.  He denies fevers and chills.   Review of Systems:  All other review of systems: otherwise negative   Vital Signs:  BP 129/80   Pulse 71   Temp 98.1 F (36.7 C) (Oral)   Resp 14   Ht 5' 8.5" (1.74 m)   Wt 260 lb (117.9 kg)   SpO2 96%   BMI 38.96 kg/m    Physical Exam:  Physical Exam Vitals reviewed.  Constitutional:      Appearance: Normal appearance.  Skin:    General: Skin is warm and dry.     Comments: Pilonidal cyst excision site healing well with a palpable underlying seroma, punctate opening at inferior aspect of incision with serous drainage noted (opening is 2 to 3 mm in size)   Neurological:     Mental Status: He is alert.     Laboratory studies: None   Imaging:  None   Assessment:  23 y.o. yo Male with ***.  Plan:  - ***  - *** - *** - Follow up  All of the above recommendations were discussed with the patient and patient's family, and all of patient's and family's questions were answered to their expressed satisfaction.  Note: Portions of this report may have been transcribed using voice recognition software. Every effort has been made to ensure accuracy; however, inadvertent computerized transcription errors may still be present.   Lidia Reels, DO [facility] [address] [place], [place] [address] [phone] (office)

## 2023-09-30 ENCOUNTER — Ambulatory Visit (INDEPENDENT_AMBULATORY_CARE_PROVIDER_SITE_OTHER): Admitting: Surgery

## 2023-09-30 VITALS — BP 134/83 | HR 58 | Temp 98.2°F | Resp 12 | Ht 68.5 in | Wt 263.0 lb

## 2023-09-30 DIAGNOSIS — T8131XD Disruption of external operation (surgical) wound, not elsewhere classified, subsequent encounter: Secondary | ICD-10-CM

## 2023-09-30 NOTE — Progress Notes (Signed)
 [facility] Note   HPI:  23 y.o. Male presents to clinic for follow-up evaluation of a small opening in drainage from his pilonidal cyst incision site.  Patient states that he has not noticed any further drainage and at least 10 days.  He denies any pain at this site.  Denies fevers and chills  Review of Systems:  All other review of systems: otherwise negative   Vital Signs:  BP 134/83   Pulse (!) 58   Temp 98.2 F (36.8 C) (Oral)   Resp 12   Ht 5' 8.5" (1.74 m)   Wt 263 lb (119.3 kg)   SpO2 95%   BMI 39.41 kg/m    Physical Exam:  Physical Exam Vitals reviewed.  Constitutional:      Appearance: Normal appearance.  Skin:    General: Skin is warm and dry.     Comments: Pilonidal cyst excision site healing well with a palpable underlying seroma, punctate opening at inferior aspect of incision with no drainage noted (opening is 2 to 3 mm in size)    Neurological:     Mental Status: He is alert.     Laboratory studies: None  Imaging:  None  Assessment:  23 y.o. yo Male who presents for wound recheck status post pilonidal cyst excision on 3/20   Plan:  - We discussed that he still has a small opening at the inferior aspect of his incision site.  However, since he is not actively having any drainage, I do not feel we need to place a stitch at this site. - Silver nitrate was applied to the opening - I explained that since the opening is still present and he still has a palpable underlying seroma, there is a chance area can start draining again.  Advised that he should call the office if the area begins causing him further problems -Call the office if you begin to have purulent drainage, fever, chills, or significant pain at the incision site - Follow up as needed  All of the above recommendations were discussed with the patient, and all of patient's questions were answered to his expressed satisfaction.  Note: Portions of this report may have been  transcribed using voice recognition software. Every effort has been made to ensure accuracy; however, inadvertent computerized transcription errors may still be present.   Lidia Reels, DO [facility] [address] [place], [place] [address] [phone] (office)

## 2023-12-21 ENCOUNTER — Ambulatory Visit
Admission: RE | Admit: 2023-12-21 | Discharge: 2023-12-21 | Disposition: A | Discharge status: Home / Self Care | Payer: Self-pay | Source: Ambulatory Visit | Attending: Family Medicine | Admitting: Family Medicine

## 2023-12-21 VITALS — BP 143/83 | HR 75 | Temp 98.5°F | Resp 16

## 2023-12-21 DIAGNOSIS — J069 Acute upper respiratory infection, unspecified: Secondary | ICD-10-CM | POA: Diagnosis not present

## 2023-12-21 MED ORDER — PROMETHAZINE-DM 6.25-15 MG/5ML PO SYRP
5.0000 mL | ORAL_SOLUTION | Freq: Four times a day (QID) | ORAL | 0 refills | Status: AC | PRN
Start: 2023-12-21 — End: ?

## 2023-12-21 MED ORDER — AZELASTINE HCL 0.1 % NA SOLN: 1.0000 | Freq: Two times a day (BID) | NASAL | 0 refills | Status: AC

## 2023-12-21 NOTE — ED Triage Notes (Signed)
 Pt reports headache sore throat and cough, worsening at night. Pt has had symptoms since Thursday of last week.

## 2023-12-21 NOTE — ED Provider Notes (Signed)
 [facility]    CSN: [id number] Arrival date & time: 12/21/23  1146      History   Chief Complaint Chief Complaint  Patient presents with   Chills    Sore throat, headache, cough, chills - Entered by patient    HPI George Jordan is a 23 y.o. male.   Patient presenting today with 5-day history of headache, chills, sore throat, congestion, cough.  Denies fever, chest pain, shortness of breath, abdominal pain, vomiting, diarrhea.  So far trying ibuprofen  and Tylenol  with minimal relief.  Multiple sick contacts with similar symptoms.    History reviewed. No pertinent past medical history.  Patient Active Problem List   Diagnosis Date Noted   Pilonidal cyst 08/06/2023    Past Surgical History:  Procedure Laterality Date   PILONIDAL CYST EXCISION N/A 08/06/2023   Procedure: EXCISION, COMPLEX PILONIDAL CYST;  Surgeon: Evonnie Dorothyann LABOR, DO;  Location: [facility];  Service: General;  Laterality: N/A;       Home Medications    Prior to Admission medications   Medication Sig Start Date End Date Taking? Authorizing Provider  azelastine  (ASTELIN ) 0.1 % nasal spray Place 1 spray into both nostrils 2 (two) times daily. Use in each nostril as directed 12/21/23  Yes Stuart Vernell Norris, PA-C  promethazine -dextromethorphan (PROMETHAZINE -DM) 6.25-15 MG/5ML syrup Take 5 mLs by mouth 4 (four) times daily as needed. 12/21/23  Yes Stuart Vernell Norris, PA-C  acetaminophen  (TYLENOL ) 500 MG tablet Take 1,000 mg by mouth every 6 (six) hours as needed for moderate pain (pain score 4-6), mild pain (pain score 1-3), fever or headache.    [provider]    Family History History reviewed. No pertinent family history.  Social History Social History   Tobacco Use   Smoking status: Some Days    Types: Cigarettes  Substance Use Topics   Alcohol use: Yes    Comment: occ   Drug use: Yes    Types: Marijuana    Comment: occ     Allergies   Augmentin  [amoxicillin-pot clavulanate]   Review of Systems Review of Systems Per HPI  Physical Exam Triage Vital Signs ED Triage Vitals  Encounter Vitals Group     BP 12/21/23 1213 (!) 143/83     Girls Systolic BP Percentile --      Girls Diastolic BP Percentile --      Boys Systolic BP Percentile --      Boys Diastolic BP Percentile --      Pulse Rate 12/21/23 1213 75     Resp 12/21/23 1213 16     Temp 12/21/23 1213 98.5 F (36.9 C)     Temp Source 12/21/23 1213 Oral     SpO2 12/21/23 1213 98 %     Weight --      Height --      Head Circumference --      Peak Flow --      Pain Score 12/21/23 1214 8     Pain Loc --      Pain Education --      Exclude from Growth Chart --    No data found.  Updated Vital Signs BP (!) 143/83 (BP Location: Right Arm)   Pulse 75   Temp 98.5 F (36.9 C) (Oral)   Resp 16   SpO2 98%   Visual Acuity Right Eye Distance:   Left Eye Distance:   Bilateral Distance:    Right Eye Near:   Left Eye  Near:    Bilateral Near:     Physical Exam Vitals and nursing note reviewed.  Constitutional:      Appearance: Normal appearance. He is well-developed.  HENT:     Head: Atraumatic.     Right Ear: Tympanic membrane and external ear normal.     Left Ear: Tympanic membrane and external ear normal.     Nose: Rhinorrhea present.     Mouth/Throat:     Pharynx: Posterior oropharyngeal erythema present. No oropharyngeal exudate.  Eyes:     Extraocular Movements: Extraocular movements intact.     Conjunctiva/sclera: Conjunctivae normal.     Pupils: Pupils are equal, round, and reactive to light.  Cardiovascular:     Rate and Rhythm: Normal rate and regular rhythm.  Pulmonary:     Effort: Pulmonary effort is normal. No respiratory distress.     Breath sounds: Normal breath sounds. No wheezing or rales.  Musculoskeletal:        General: Normal range of motion.     Cervical back: Normal range of motion and neck supple.  Lymphadenopathy:     Cervical:  No cervical adenopathy.  Skin:    General: Skin is warm and dry.  Neurological:     General: No focal deficit present.     Mental Status: He is alert and oriented to person, place, and time.  Psychiatric:        Mood and Affect: Mood normal.        Behavior: Behavior normal.        Thought Content: Thought content normal.        Judgment: Judgment normal.      UC Treatments / Results  Labs (all labs ordered are listed, but only abnormal results are displayed) Labs Reviewed - No data to display  EKG   Radiology No results found.  Procedures Procedures (including critical care time)  Medications Ordered in UC Medications - No data to display  Initial Impression / Assessment and Plan / UC Course  I have reviewed the triage vital signs and the nursing notes.  Pertinent labs & imaging results that were available during my care of the patient were reviewed by me and considered in my medical decision making (see chart for details).     Minimally hypertensive in triage, otherwise vital signs within normal limits.  Suspect viral respiratory infection, will treat with Phenergan  DM, Astelin , supportive over-the-counter medications and home care.  Return for worsening symptoms.  Work note given.  Final Clinical Impressions(s) / UC Diagnoses   Final diagnoses:  Viral URI with cough   Discharge Instructions   None    ED Prescriptions     Medication Sig Dispense Auth. Provider   promethazine -dextromethorphan (PROMETHAZINE -DM) 6.25-15 MG/5ML syrup Take 5 mLs by mouth 4 (four) times daily as needed. 100 mL Stuart Vernell Norris, PA-C   azelastine  (ASTELIN ) 0.1 % nasal spray Place 1 spray into both nostrils 2 (two) times daily. Use in each nostril as directed 30 mL Stuart Vernell Norris, PA-C      PDMP not reviewed this encounter.   Stuart Vernell Norris, [place] 12/21/23 1245
